# Patient Record
Sex: Male | Born: 1940
Health system: Southern US, Community
[De-identification: ages and names within clinical notes are randomized; demographics above are authoritative.]

## PROBLEM LIST (undated history)

## (undated) DIAGNOSIS — I10 Essential (primary) hypertension: Secondary | ICD-10-CM

## (undated) DIAGNOSIS — M1990 Unspecified osteoarthritis, unspecified site: Secondary | ICD-10-CM

## (undated) DIAGNOSIS — I219 Acute myocardial infarction, unspecified: Secondary | ICD-10-CM

## (undated) DIAGNOSIS — I2510 Atherosclerotic heart disease of native coronary artery without angina pectoris: Secondary | ICD-10-CM

## (undated) DIAGNOSIS — Z87442 Personal history of urinary calculi: Secondary | ICD-10-CM

## (undated) DIAGNOSIS — E782 Mixed hyperlipidemia: Secondary | ICD-10-CM

## (undated) HISTORY — PX: CARDIAC CATHETERIZATION: SHX172

## (undated) HISTORY — DX: Acute myocardial infarction, unspecified: I21.9

## (undated) HISTORY — DX: Essential (primary) hypertension: I10

## (undated) HISTORY — DX: Atherosclerotic heart disease of native coronary artery without angina pectoris: I25.10

## (undated) HISTORY — PX: OTHER SURGICAL HISTORY: SHX169

## (undated) HISTORY — DX: Mixed hyperlipidemia: E78.2

---

## 2005-12-16 HISTORY — PX: EXPLORATORY LAPAROTOMY: SUR591

## 2008-10-25 ENCOUNTER — Inpatient Hospital Stay (HOSPITAL_COMMUNITY): Admission: AD | Admit: 2008-10-25 | Discharge: 2008-10-27 | Payer: Self-pay | Admitting: Cardiology

## 2010-02-13 ENCOUNTER — Encounter: Payer: Self-pay | Admitting: Cardiology

## 2010-06-15 ENCOUNTER — Encounter: Payer: Self-pay | Admitting: Cardiology

## 2010-07-04 ENCOUNTER — Encounter: Payer: Self-pay | Admitting: Cardiology

## 2010-07-17 ENCOUNTER — Encounter: Payer: Self-pay | Admitting: Cardiology

## 2010-08-28 ENCOUNTER — Encounter: Payer: Self-pay | Admitting: Cardiology

## 2010-10-08 ENCOUNTER — Encounter: Payer: Self-pay | Admitting: Cardiology

## 2010-11-29 ENCOUNTER — Encounter: Payer: Self-pay | Admitting: Cardiology

## 2010-12-19 ENCOUNTER — Encounter: Payer: Self-pay | Admitting: Cardiology

## 2010-12-25 ENCOUNTER — Encounter: Payer: Self-pay | Admitting: Cardiology

## 2011-01-17 NOTE — Letter (Signed)
Summary: TERRY DANIEL,MD - OFFICE NOTE  TERRY DANIEL,MD - OFFICE NOTE   Imported By: Claudette Laws 12/27/2010 16:38:29  _____________________________________________________________________  External Attachment:    Type:   Image     Comment:   External Document

## 2011-01-17 NOTE — Letter (Signed)
Summary: External Correspondence/ [facility]  External Correspondence/ [facility]   Imported By: Dorise Hiss 12/27/2010 15:00:01  _____________________________________________________________________  External Attachment:    Type:   Image     Comment:   External Document

## 2011-01-17 NOTE — Letter (Signed)
Summary: External Correspondence/ [facility]  External Correspondence/ [facility]   Imported By: Dorise Hiss 12/27/2010 14:55:14  _____________________________________________________________________  External Attachment:    Type:   Image     Comment:   External Document

## 2011-01-17 NOTE — Letter (Signed)
Summary: Donzetta Sprung  MD- OFFICE NOTE  Donzetta Sprung  MD- OFFICE NOTE   Imported By: Claudette Laws 12/27/2010 16:48:19  _____________________________________________________________________  External Attachment:    Type:   Image     Comment:   External Document

## 2011-01-17 NOTE — Letter (Signed)
Summary: External Correspondence/ [facility]  External Correspondence/ [facility]   Imported By: Dorise Hiss 12/27/2010 14:51:56  _____________________________________________________________________  External Attachment:    Type:   Image     Comment:   External Document

## 2011-01-17 NOTE — Letter (Signed)
Summary: TERRY DANIEL,MD - OFFICE NOTE  TERRY DANIEL,MD - OFFICE NOTE   Imported By: Claudette Laws 12/27/2010 16:47:39  _____________________________________________________________________  External Attachment:    Type:   Image     Comment:   External Document

## 2011-01-17 NOTE — Letter (Signed)
Summary: External Correspondence/  [facility]  External Correspondence/  [facility]   Imported By: Dorise Hiss 12/27/2010 15:01:04  _____________________________________________________________________  External Attachment:    Type:   Image     Comment:   External Document

## 2011-01-17 NOTE — Letter (Signed)
Summary: External Correspondence/ [facility]  External Correspondence/ [facility]   Imported By: Dorise Hiss 12/27/2010 14:57:07  _____________________________________________________________________  External Attachment:    Type:   Image     Comment:   External Document

## 2011-01-17 NOTE — Letter (Signed)
Summary: External Correspondence/ [facility]  External Correspondence/ [facility]   Imported By: Dorise Hiss 12/27/2010 14:58:30  _____________________________________________________________________  External Attachment:    Type:   Image     Comment:   External Document

## 2011-01-29 ENCOUNTER — Ambulatory Visit: Payer: Self-pay | Admitting: Cardiology

## 2011-04-30 NOTE — Op Note (Signed)
NAMETAKSH, Harmon               ACCOUNT NO.:  [id number]   MEDICAL RECORD NO.:  [id number]          PATIENT TYPE:  INP   LOCATION:  2905                         FACILITY:  [facility]   PHYSICIAN:  Eduardo Osier. Sharyn Lull, M.D. DATE OF BIRTH:  23-Jun-1941   DATE OF PROCEDURE:  10/25/2008  DATE OF DISCHARGE:                               OPERATIVE REPORT   PROCEDURE:  1. Left cardiac cath with selective left and right coronary      angiography, LV graphy via right groin using Judkins technique.  2. Successful PTCA to mid RCA using 2.0 x 8-mm long Voyager balloon      and then 2.5 x 12-mm long Voyager balloon.  3. Successful deployment of 2.75 x 18-mm long Promus drug-eluting      stent in mid RCA.  4. Successful post dilatation of Promus drug-eluting stent using 3.0 x      15-mm long [place] Voyager balloon.   INDICATION FOR THE PROCEDURE:  David Harmon is a 70 year old white male  with past medical history significant for hypertension,  hypercholesteremia, remote history of tobacco abuse, and positive family  history of coronary artery disease.  He was transferred from [facility] for PCI.  The patient complains of recurrent exertional chest  pain off and on since Sunday, relieved with rest without associated  nausea, vomiting, or diaphoresis.  Denies any shortness of breath.  Denies palpitation, lightheadedness, or syncope.  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  EKG done at [facility] showed normal sinus rhythm with left  anterior fascicular block and right bundle-branch block with secondary  ST-T wave changes.  The patient received nitro paste, heparin, aspirin,  and beta-blockers at [facility] with relief of chest pain and was  transferred here for PCI.  I discussed with the patient regarding left  cath, possible PTCA, stenting, its risks and  benefits i.e. death, MI,  stroke, need for emergency CABG, risk of restenosis, local vascular  complications etc., and consented for the procedure.   PROCEDURE:  After obtaining the informed consent, the patient was  brought directly to the cath lab from [facility] and was placed  on fluoroscopy table.  Right groin was prepped and draped in usual  fashion.  A 2% Xylocaine was used for local anesthesia in the right  groin.  With the help of thin-wall needle, 6-French arterial sheath was  placed.  The sheath was aspirated and flushed.  Next, 6-French left  Judkins catheter was advanced over the wire under fluoroscopic guidance  up to the ascending aorta.  Wire was pulled out.  The catheter was  aspirated and connected to the manifold.  Catheter was further advanced  and engaged into left coronary ostium.  Multiple views of the left  system were taken.  Next, the catheter was disengaged and was pulled out  over the wire and was replaced with 6-French right Judkins catheter  which was advanced over the wire under fluoroscopic guidance  up to the  ascending aorta.  Wire was pulled out.  The catheter was aspirated and  connected to the manifold.  Catheter was further advanced and engaged  into right coronary ostium.  Multiple views of the right system were  taken.  Next, the catheter was disengaged and was pulled out over the  wire and was replaced with 6-French pigtail catheter which was advanced  over the wire under fluoroscopic guidance up to the ascending aorta.  Wire was pulled out.  The catheter was aspirated and connected to the  manifold.  Catheter was further advanced across aortic valve into the  LV.  LV pressures were recorded.  Next, LV graft was done in 30 degree  RAO position.  Post angiographic pressures were recorded from LV and  then pullback pressures were recorded from the aorta.  There was no  gradient across the aortic valve.  Next, the pigtail catheter was pulled   out over the wire.  Sheaths were aspirated and flushed.   FINDINGS:  LV showed mild inferior wall hypokinesia, EF of 50-55%.  Left  main was short which was patent.  LAD has 30-40% proximal stenosis.  Diagonal 1 has 50-60% ostial and proximal stenosis and 30-40% mid  stenosis in the inferior branch.  Left circumflex has 10-15% ostial and  50-60% proximal bifurcation stenosis with OM-2.  OM-1 is very very  small.  OM-2 has 20-30% ostial stenosis.  OM-3 is very very small.  OM-4  is moderate sized which is patent.  RCA has 10-15% proximal stenosis and  99% mid stenosis with haziness with TIMI +2 flow which is the culprit  lesion for his non-Q-wave MI.  PDA and PLV branches were small which had  mild disease.   INTERVENTIONAL PROCEDURE:  Successful PTCA to mid RCA was done using  initially 2.2 x 8-mm long Voyager balloon and then 2.5 x 12-mm long  Voyager balloon for predilatation and then 2.75 x 18-mm long Promus drug-  eluting stent was deployed at 13 atmospheric pressure in mid RCA.  Stent  was postdilated using 3.0 x 15-mm long [place] Voyager balloon going up to 18  atmospheric pressure.  Lesion was dilated from 99% to 0% residual with  excellent TIMI grade 3 distal flow without evidence of dissection or  distal embolization.  The patient received weight-based heparin,  Integrilin, and 600 mg of Plavix prior to the procedure.  The patient  tolerated the procedure well.  There were no complications.  The patient  was transferred to recovery room in stable condition.       Eduardo Osier. Sharyn Lull, M.D.  Electronically Signed     MNH/MEDQ  D:  10/25/2008  T:  10/26/2008  Job:  [id number]   cc:   Cath Lab

## 2011-04-30 NOTE — Discharge Summary (Signed)
NAMEAGUSTINE, ROSSITTO               ACCOUNT NO.:  [id number]   MEDICAL RECORD NO.:  [id number]          PATIENT TYPE:  INP   LOCATION:  3707                         FACILITY:  [facility]   PHYSICIAN:  Mohan N. Sharyn Lull, M.D. DATE OF BIRTH:  06/26/1941   DATE OF ADMISSION:  10/25/2008  DATE OF DISCHARGE:  10/27/2008                               DISCHARGE SUMMARY   ADMITTING DIAGNOSES:  1. Acute non-Q-wave myocardial infarction.  2. Hypertension.  3. Hypercholesteremia.  4. Remote history of tobacco abuse.  5. Positive family history of coronary artery disease.   DISCHARGE DIAGNOSES:  1. Status post acute non-Q-wave myocardial infarction, status post      percutaneous transluminal coronary angioplasty and stenting to      right coronary artery.  2. Hypertension.  3. Hypercholesteremia.  4. Remote history of tobacco abuse.  5. Positive family history of coronary artery disease.   DISCHARGE HOME MEDICATIONS:  1. Enteric-coated aspirin 325 mg 1 tablet daily.  2. Plavix 75 mg 1 tablet daily.  3. Zocor 40 mg 1 tablet daily.  4. Toprol-XL 25 mg half tablet daily.  5. Lisinopril 5 mg 1 tablet daily.  6. Nitrostat 0.4 mg sublingual as needed.  7. Niaspan 500 mg 1 tablet daily at night.   DIET:  Low salt, low cholesterol.   Post PTCA stent instructions have been given.   ACTIVITY:  Avoid any lifting, pushing, or pulling for 1 week.  Increase  activity slowly as tolerated.  Follow up with me in 1 week.   CONDITION AT DISCHARGE:  Stable.   BRIEF HISTORY AND HOSPITAL COURSE:  Mr. Rigor is a 70 year old white  male with past medical history significant for hypertension,  hypercholesteremia, remote history of tobacco abuse, positive family  history of coronary artery disease, transferred from [facility]  for PCI.  The patient complains of recurrent exertional chest pain off  and on since Sunday, relieved with rest without associated nausea,  vomiting, or diaphoresis.  Denies  shortness of breath.  Denies  palpitation, lightheadedness, or syncope.  The patient went to [facility] and was noted to have minimally elevated troponin I and CPK-MB.  EKG showed normal sinus rhythm with right bundle-branch block with left  anterior fascicular block with secondary ST-T wave changes.  The patient  received nitro paste, heparin, aspirin, and beta-blockers at [facility] with relief of chest pain and was transferred to [facility] for  further treatment.   PAST MEDICAL HISTORY:  As above.   PAST SURGICAL HISTORY:  He had laparotomy for intestinal obstruction  many years ago.   MEDICATIONS AT HOME:  He was on:  1. Hydrochlorothiazide 25 mg p.o. daily.  2. Zocor 40 mg p.o. daily.   ALLERGIES:  No known drug allergies.   SOCIAL HISTORY:  He smoked less than 1 pack for 2-3 years, quit 35 years  ago.  No history of alcohol or drug abuse.   FAMILY HISTORY:  Father died of MI at the age of 64.  Mother had MI x3  in her 62s.  One sister had heart problem.  PHYSICAL EXAMINATION:  GENERAL:  He is alert, awake, and oriented x3, in  no acute distress.  VITAL SIGNS:  Blood pressure was 104/67 and pulse was 58 and regular.  HEENT:  Conjunctivae was pink.  NECK:  Supple.  No JVD.  No bruit.  LUNGS:  Clear to auscultation without rhonchi or rales.  CARDIOVASCULAR:  S1 and S2 was normal.  There was soft systolic murmur.  ABDOMEN:  Soft.  Bowel sounds are present.  Nontender.  EXTREMITIES:  There is no clubbing, cyanosis, or edema.   LABORATORIES:  Hemoglobin was 12.4, hematocrit 37.1, and white count of  7.6.  His BUN was 9, creatinine 0.67, glucose 90, and potassium 3.4.  His cardiac enzymes; CPK was 211, MB of 16.4, and relative index 7.8.  Repeat CPK was 202, MB 12.2, and relative index 6.0.  Troponin I was  1.41.  Today's CPK is 141, MB 2.8, and relative index 2.0.  Troponin I  has come down to 0.60.  Today, his hemoglobin is 13.7, hematocrit 41.1,  and  white count of 7.3.  His BUN is 13, creatinine 0.79, glucose 98, and  potassium 3.7.  His total cholesterol was 128, LDL was 79, and HDL was  30.   BRIEF HOSPITAL COURSE:  The patient was transferred from [facility] directly to the [facility] Cath Lab.  The patient  subsequently underwent left cardiac cath with selective left and right  coronary angiography and PTCA and stenting to mid RCA.   HOSPITAL PROCEDURE REPORT:  The patient did not have any complications.  Postprocedure, the patient did not have any episodes of chest pain or  cardiac arrhythmias.  His groin is stable with no evidence of hematoma  or bruit.  Phase I cardiac rehab was called.  The patient has been  ambulating in hallway without any problems.  The patient will be  discharged home on above medications.      Eduardo Osier. Sharyn Lull, M.D.  Electronically Signed     MNH/MEDQ  D:  10/27/2008  T:  10/27/2008  Job:  [id number]

## 2011-07-08 ENCOUNTER — Encounter: Payer: Self-pay | Admitting: Cardiology

## 2011-07-10 ENCOUNTER — Encounter: Payer: Self-pay | Admitting: Cardiology

## 2011-07-10 ENCOUNTER — Ambulatory Visit (INDEPENDENT_AMBULATORY_CARE_PROVIDER_SITE_OTHER): Payer: Medicare (Managed Care) | Admitting: Cardiology

## 2011-07-10 VITALS — BP 128/90 | HR 75 | Resp 18 | Ht 70.0 in | Wt 181.8 lb

## 2011-07-10 DIAGNOSIS — R002 Palpitations: Secondary | ICD-10-CM

## 2011-07-10 DIAGNOSIS — E782 Mixed hyperlipidemia: Secondary | ICD-10-CM

## 2011-07-10 DIAGNOSIS — I2510 Atherosclerotic heart disease of native coronary artery without angina pectoris: Secondary | ICD-10-CM | POA: Insufficient documentation

## 2011-07-10 DIAGNOSIS — I10 Essential (primary) hypertension: Secondary | ICD-10-CM

## 2011-07-10 NOTE — Progress Notes (Signed)
Clinical Summary [name] is a 70 y.o.male presenting to establish ongoing cardiology followup. He has been followed by Dr. Sharyn Lull regularly since 2009 with intervention outlined below. He is changing practice related to insurance coverage.  From a symptom perspective he reports doing quite well with no major functional limitations, no angina, NYHA class I dyspnea on exertion. He reports compliance with his medications, back on statin therapy within the last 3 months under the direction of Dr. Reuel Boom who has been following his lipids.   ECG is reviewed below. He denies any palpitations or syncope, no orthopnea or PND, no lower extremity edema.  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  No Known Allergies  Medication list reviewed.  Past Medical History  Diagnosis Date  . Coronary atherosclerosis of native coronary artery     DES RCA 11/09 (Dr. Sharyn Lull)  . Myocardial infarction     NSTEMI 11/09  . Mixed hyperlipidemia   . Essential hypertension, benign     Past Surgical History  Procedure Date  . Exploratory laparotomy 2007    Small bowel obstruction  . Resection of pilonidal cyst     Family History  Problem Relation Age of Onset  . Coronary artery disease Father   . Coronary artery disease Mother   . Diabetes type II Sister     Social History Mr. Harmon reports that he has quit smoking. His smoking use included Cigarettes. He has never used smokeless tobacco. [name] reports that he does not drink alcohol.  Review of Systems Mentions arthritic discomfort, mainly in his hands. Otherwise reviewed and negative.  Physical Examination Filed Vitals:   07/10/11 1503  BP: 158/85  Pulse: 63  Resp: 18  Well-developed, bearded male in no acute distress. HEENT: Conjunctiva and lids normal, oropharynx with moist mucosa. Neck: Supple, no elevated  JVP or carotid bruits, no thyromegaly. Lungs: Clear to auscultation, nonlabored. Cardiac: Regular rate and rhythm, no S3 gallop or pericardial rub, no significant murmur. Abdomen: Soft, nontender, no bruits, bowel sounds present. Skin: Warm and dry, no ulcerations. Extremities: No pitting edema, distal pulses 1-2+. Musculoskeletal: No kyphosis. Osteoarthritic changes in the hands. Neuropsychiatric: Alert and oriented x3, affect appropriate.   ECG Normal sinus rhythm with right bundle branch block and left anterior fascicular block.   Problem List and Plan

## 2011-07-10 NOTE — Patient Instructions (Signed)
Your physician wants you to follow-up in: 6 months. You will receive a reminder letter in the mail one-two months in advance. If you don't receive a letter, please call our office to schedule the follow-up appointment. Your physician recommends that you continue on your current medications as directed. Please refer to the Current Medication list given to you today. 

## 2011-07-10 NOTE — Assessment & Plan Note (Signed)
Symptomatically stable on medical therapy. ECG reviewed. Plan to continue observation at this time. Seems functionally quite active without major limitation. Can consider a followup stress test further down the road, sooner if symptoms intervene.

## 2011-07-10 NOTE — Assessment & Plan Note (Signed)
Followed by Dr. Reuel Boom. Will request most recent lab work for review. Patient tolerating Lipitor.

## 2011-07-10 NOTE — Assessment & Plan Note (Signed)
Continue to keep an eye on blood pressure. He does check it at home.

## 2011-08-01 ENCOUNTER — Other Ambulatory Visit: Payer: Self-pay | Admitting: *Deleted

## 2011-08-01 MED ORDER — LISINOPRIL 10 MG PO TABS: 10.0000 mg | ORAL_TABLET | Freq: Every day | ORAL | Status: DC

## 2011-08-26 ENCOUNTER — Other Ambulatory Visit: Payer: Self-pay | Admitting: *Deleted

## 2011-08-26 MED ORDER — CLOPIDOGREL BISULFATE 75 MG PO TABS: 75.0000 mg | ORAL_TABLET | Freq: Every day | ORAL | Status: DC

## 2011-09-17 LAB — BASIC METABOLIC PANEL
BUN: 13
CO2: 26
Chloride: 106
Creatinine, Ser: 0.79
Potassium: 3.7

## 2011-09-17 LAB — CARDIAC PANEL(CRET KIN+CKTOT+MB+TROPI)
Relative Index: 2
Total CK: 141
Troponin I: 1.41

## 2011-09-17 LAB — COMPREHENSIVE METABOLIC PANEL
ALT: 15
AST: 28
Alkaline Phosphatase: 35 — ABNORMAL LOW
Alkaline Phosphatase: 35 — ABNORMAL LOW
BUN: 9
CO2: 24
CO2: 29
Chloride: 105
Chloride: 105
GFR calc non Af Amer: >60
Glucose, Bld: 90
Glucose, Bld: 98
Potassium: 3.4 — ABNORMAL LOW
Potassium: 3.8
Sodium: 137
Total Bilirubin: 0.9
Total Bilirubin: 0.9

## 2011-09-17 LAB — CBC
HCT: 37.1 — ABNORMAL LOW
HCT: 41.1
Hemoglobin: 12.4 — ABNORMAL LOW
Hemoglobin: 12.6 — ABNORMAL LOW
MCHC: 33.3
MCHC: 33.5
MCV: 90.9
Platelets: 243
RBC: 4.18 — ABNORMAL LOW
RBC: 4.52
RDW: 12.8
WBC: 7.3
WBC: 8.4

## 2011-09-17 LAB — DIFFERENTIAL
Basophils Absolute: 0
Basophils Relative: 0
Neutro Abs: 5
Neutrophils Relative %: 65

## 2011-09-17 LAB — LIPID PANEL
Cholesterol: 128
Total CHOL/HDL Ratio: 4.3
VLDL: 19

## 2012-03-16 ENCOUNTER — Ambulatory Visit: Payer: Medicare (Managed Care) | Admitting: Cardiology

## 2012-03-17 ENCOUNTER — Ambulatory Visit (INDEPENDENT_AMBULATORY_CARE_PROVIDER_SITE_OTHER): Payer: Medicare Other | Admitting: Cardiology

## 2012-03-17 ENCOUNTER — Encounter: Payer: Self-pay | Admitting: Cardiology

## 2012-03-17 VITALS — BP 162/83 | HR 68 | Ht 70.0 in | Wt 182.0 lb

## 2012-03-17 DIAGNOSIS — I10 Essential (primary) hypertension: Secondary | ICD-10-CM

## 2012-03-17 DIAGNOSIS — E782 Mixed hyperlipidemia: Secondary | ICD-10-CM

## 2012-03-17 DIAGNOSIS — I2510 Atherosclerotic heart disease of native coronary artery without angina pectoris: Secondary | ICD-10-CM

## 2012-03-17 NOTE — Patient Instructions (Signed)
Your physician wants you to follow-up in: 6 months. You will receive a reminder letter in the mail one-two months in advance. If you don't receive a letter, please call our office to schedule the follow-up appointment. Your physician recommends that you continue on your current medications as directed. Please refer to the Current Medication list given to you today. 

## 2012-03-17 NOTE — Progress Notes (Signed)
   Clinical Summary [name] is a 71 y.o.male presenting for followup. He was seen in July 2012. He continues to do well, works full time. Reports no angina, no nitroglycerin use. No increasing shortness of breath. Followup ECG is stable.  Recent lab work from January showed BUN 15, creatinine 0.9, potassium 5.0, AST 25, ALT 21, cholesterol 130, triglycerides 60, HDL 47, LDL 71. He reports compliance with his medications.  We did discuss potential for followup stress testing, although at this point he was most comfortable with observation, not unreasonable since he has been symptomatically stable.  Blood pressure was up some today compared to last visit. He states that it has typically been better when he checks it. He does have a blood pressure cuff at home.  No Known Allergies  Current Outpatient Prescriptions  Medication Sig Dispense Refill  . aspirin 81 MG tablet Take 81 mg by mouth daily.        Marland Kitchen atorvastatin (LIPITOR) 40 MG tablet Take 40 mg by mouth daily.        . clopidogrel (PLAVIX) 75 MG tablet Take 1 tablet (75 mg total) by mouth daily.  90 tablet  3  . imiquimod (ALDARA) 5 % cream Apply 1 application topically as needed.       Marland Kitchen lisinopril (PRINIVIL,ZESTRIL) 10 MG tablet Take 1 tablet (10 mg total) by mouth daily.  90 tablet  3  . metoprolol succinate (TOPROL-XL) 25 MG 24 hr tablet Take 25 mg by mouth daily.        . Multiple Vitamins-Minerals (DAILY MENS HEALTH FORMULA PO) Take 1 tablet by mouth daily.       . nitroGLYCERIN (NITROSTAT) 0.4 MG SL tablet Place 0.4 mg under the tongue every 5 (five) minutes as needed.        Past Medical History  Diagnosis Date  . Coronary atherosclerosis of native coronary artery     DES RCA 11/09 (Dr. Sharyn Lull)  . Myocardial infarction     NSTEMI 11/09  . Mixed hyperlipidemia   . Essential hypertension, benign     Social History Mr. Konecny reports that he quit smoking about 46 years ago. His smoking use included Cigarettes. He has  a 5 pack-year smoking history. He has never used smokeless tobacco. Mr. Jewel reports that he does not drink alcohol.  Review of Systems No palpitations, orthopnea, PND. No bleeding problems. Otherwise negative.  Physical Examination Filed Vitals:   03/17/12 1324  BP: 162/83  Pulse: 68   Well-developed, bearded male in no acute distress.  HEENT: Conjunctiva and lids normal, oropharynx with moist mucosa.  Neck: Supple, no elevated JVP or carotid bruits, no thyromegaly.  Lungs: Clear to auscultation, nonlabored.  Cardiac: Regular rate and rhythm, no S3 gallop or pericardial rub, no significant murmur.  Abdomen: Soft, nontender, no bruits, bowel sounds present.  Skin: Warm and dry, no ulcerations.  Extremities: No pitting edema, distal pulses 1-2+.  Musculoskeletal: No kyphosis. Osteoarthritic changes in the hands.  Neuropsychiatric: Alert and oriented x3, affect appropriate.   ECG Sinus rhythm with right bundle-branch block and left anterior fascicular block, LVH.   Problem List and Plan

## 2012-03-17 NOTE — Assessment & Plan Note (Signed)
Symptomatically stable on medical therapy. We discussed warning signs. He remains comfortable of observation at this point.

## 2012-03-17 NOTE — Assessment & Plan Note (Signed)
Blood pressure is up today, was better controlled at the last visit. I asked him to keep a log of his blood pressure at home. He may need further medication adjustment.

## 2012-03-17 NOTE — Assessment & Plan Note (Signed)
Lipids have been well controlled. 

## 2012-04-23 ENCOUNTER — Other Ambulatory Visit: Payer: Self-pay | Admitting: *Deleted

## 2012-04-23 MED ORDER — METOPROLOL SUCCINATE ER 25 MG PO TB24: 25.0000 mg | ORAL_TABLET | Freq: Every day | ORAL | Status: DC

## 2012-04-27 ENCOUNTER — Other Ambulatory Visit: Payer: Self-pay | Admitting: *Deleted

## 2012-04-27 MED ORDER — LISINOPRIL 10 MG PO TABS: 10.0000 mg | ORAL_TABLET | Freq: Every day | ORAL | Status: DC

## 2012-09-07 ENCOUNTER — Ambulatory Visit: Payer: Medicare Other | Admitting: Cardiology

## 2012-09-18 ENCOUNTER — Encounter: Payer: Self-pay | Admitting: Cardiology

## 2012-09-18 ENCOUNTER — Ambulatory Visit (INDEPENDENT_AMBULATORY_CARE_PROVIDER_SITE_OTHER): Payer: Medicare Other | Admitting: Cardiology

## 2012-09-18 VITALS — BP 154/86 | HR 67 | Ht 70.5 in | Wt 182.0 lb

## 2012-09-18 DIAGNOSIS — E782 Mixed hyperlipidemia: Secondary | ICD-10-CM

## 2012-09-18 DIAGNOSIS — I10 Essential (primary) hypertension: Secondary | ICD-10-CM

## 2012-09-18 DIAGNOSIS — I2510 Atherosclerotic heart disease of native coronary artery without angina pectoris: Secondary | ICD-10-CM

## 2012-09-18 MED ORDER — NITROGLYCERIN 0.4 MG SL SUBL: 0.4000 mg | SUBLINGUAL_TABLET | SUBLINGUAL | Status: DC | PRN

## 2012-09-18 NOTE — Progress Notes (Signed)
   Clinical Summary [name] is a 71 y.o.male presenting for followup. He was seen in April. He continues to do very well, working full time, also enjoying outdoor chores. He reports no active angina symptoms or unusual shortness of breath. States that he has been tolerating his medications well.  ECG review today is stable. He has had lab work and followup with Dr. Reuel Boom. Reports no change in his lipid regimen.   No Known Allergies  Current Outpatient Prescriptions  Medication Sig Dispense Refill  . aspirin 81 MG tablet Take 81 mg by mouth daily.        Marland Kitchen atorvastatin (LIPITOR) 40 MG tablet Take 40 mg by mouth daily.        . clopidogrel (PLAVIX) 75 MG tablet Take 1 tablet (75 mg total) by mouth daily.  90 tablet  3  . imiquimod (ALDARA) 5 % cream Apply 1 application topically as needed.       Marland Kitchen lisinopril (PRINIVIL,ZESTRIL) 10 MG tablet Take 1 tablet (10 mg total) by mouth daily.  90 tablet  3  . metoprolol succinate (TOPROL-XL) 25 MG 24 hr tablet Take 1 tablet (25 mg total) by mouth daily.  30 tablet  6  . Multiple Vitamins-Minerals (DAILY MENS HEALTH FORMULA PO) Take 1 tablet by mouth daily.       . nitroGLYCERIN (NITROSTAT) 0.4 MG SL tablet Place 1 tablet (0.4 mg total) under the tongue every 5 (five) minutes as needed for chest pain.  25 tablet  6  . DISCONTD: nitroGLYCERIN (NITROSTAT) 0.4 MG SL tablet Place 0.4 mg under the tongue every 5 (five) minutes as needed.        Past Medical History  Diagnosis Date  . Coronary atherosclerosis of native coronary artery     DES RCA 11/09 (Dr. Sharyn Lull)  . Myocardial infarction     NSTEMI 11/09  . Mixed hyperlipidemia   . Essential hypertension, benign     Social History Mr. Dilauro reports that he quit smoking about 46 years ago. His smoking use included Cigarettes. He has a 5 pack-year smoking history. He has never used smokeless tobacco. Mr. Swoyer reports that he does not drink alcohol.  Review of Systems No dizziness,  palpitations, syncope. No bleeding episodes. No claudication. Otherwise negative.  Physical Examination Filed Vitals:   09/18/12 1456  BP: 154/86  Pulse: 67   Filed Weights   09/18/12 1456  Weight: 182 lb (82.555 kg)    HEENT: Conjunctiva and lids normal, oropharynx with moist mucosa.  Neck: Supple, no elevated JVP or carotid bruits, no thyromegaly.  Lungs: Clear to auscultation, nonlabored.  Cardiac: Regular rate and rhythm, no S3 gallop or pericardial rub, no significant murmur.  Abdomen: Soft, nontender, no bruits, bowel sounds present.  Skin: Warm and dry, no ulcerations.  Extremities: No pitting edema, distal pulses 1-2+.  Musculoskeletal: No kyphosis. Osteoarthritic changes in the hands.  Neuropsychiatric: Alert and oriented x3, affect appropriate.    Problem List and Plan   Coronary atherosclerosis of native coronary artery Symptomatically stable on medical therapy. ECG is also stable. We continue a strategy of observation. We have discussed warning signs that might precipitate further cardiac testing. Followup arranged.  Essential hypertension, benign I asked him to keep an eye on his blood pressure as before. Could always consider a combination ACE inhibitor HCTZ. Keep regular followup with Dr. Reuel Boom.  Mixed hyperlipidemia Continue Lipitor.    Jonelle Sidle, M.D., F.A.C.C.

## 2012-09-18 NOTE — Patient Instructions (Addendum)
Your physician recommends that you schedule a follow-up appointment in: 6 months  

## 2012-09-18 NOTE — Assessment & Plan Note (Signed)
Symptomatically stable on medical therapy. ECG is also stable. We continue a strategy of observation. We have discussed warning signs that might precipitate further cardiac testing. Followup arranged.

## 2012-09-18 NOTE — Assessment & Plan Note (Addendum)
I asked him to keep an eye on his blood pressure as before. Could always consider a combination ACE inhibitor HCTZ. Keep regular followup with Dr. Reuel Boom.

## 2012-09-18 NOTE — Assessment & Plan Note (Signed)
-  Continue Lipitor °

## 2012-09-21 ENCOUNTER — Other Ambulatory Visit: Payer: Self-pay | Admitting: Cardiology

## 2012-09-21 MED ORDER — CLOPIDOGREL BISULFATE 75 MG PO TABS: 75.0000 mg | ORAL_TABLET | Freq: Every day | ORAL | Status: DC

## 2012-11-18 ENCOUNTER — Other Ambulatory Visit: Payer: Self-pay | Admitting: Cardiology

## 2012-11-18 MED ORDER — METOPROLOL SUCCINATE ER 25 MG PO TB24: 25.0000 mg | ORAL_TABLET | Freq: Every day | ORAL | Status: DC

## 2013-03-31 ENCOUNTER — Ambulatory Visit (INDEPENDENT_AMBULATORY_CARE_PROVIDER_SITE_OTHER): Payer: Medicare Other | Admitting: Cardiology

## 2013-03-31 ENCOUNTER — Encounter: Payer: Self-pay | Admitting: Cardiology

## 2013-03-31 VITALS — BP 180/96 | HR 71 | Ht 70.0 in | Wt 184.0 lb

## 2013-03-31 DIAGNOSIS — I2510 Atherosclerotic heart disease of native coronary artery without angina pectoris: Secondary | ICD-10-CM

## 2013-03-31 DIAGNOSIS — I10 Essential (primary) hypertension: Secondary | ICD-10-CM

## 2013-03-31 DIAGNOSIS — E782 Mixed hyperlipidemia: Secondary | ICD-10-CM

## 2013-03-31 NOTE — Progress Notes (Signed)
   Clinical Summary David Harmon is a 72 y.o.male last seen in October 2013. He has been doing very well. No angina or unusual shortness of breath. 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  ECG today shows sinus rhythm with RBBB and LAFB, LVH.  He had a recent wellness visit with Dr. Reuel Harmon, states that his lipid profile looked good. Blood pressure also was much better at that visit. I asked him to keep an eye on this.   No Known Allergies  Current Outpatient Prescriptions  Medication Sig Dispense Refill  . aspirin 81 MG tablet Take 81 mg by mouth daily.        Marland Kitchen atorvastatin (LIPITOR) 40 MG tablet Take 40 mg by mouth daily.        . clopidogrel (PLAVIX) 75 MG tablet Take 1 tablet (75 mg total) by mouth daily.  90 tablet  3  . imiquimod (ALDARA) 5 % cream Apply 1 application topically as needed.       Marland Kitchen lisinopril (PRINIVIL,ZESTRIL) 10 MG tablet Take 1 tablet (10 mg total) by mouth daily.  90 tablet  3  . metoprolol succinate (TOPROL-XL) 25 MG 24 hr tablet Take 1 tablet (25 mg total) by mouth daily.  30 tablet  6  . Multiple Vitamins-Minerals (DAILY MENS HEALTH FORMULA PO) Take 1 tablet by mouth daily.       . nitroGLYCERIN (NITROSTAT) 0.4 MG SL tablet Place 1 tablet (0.4 mg total) under the tongue every 5 (five) minutes as needed for chest pain.  25 tablet  6   No current facility-administered medications for this visit.    Past Medical History  Diagnosis Date  . Coronary atherosclerosis of native coronary artery     DES RCA 11/09 (Dr. Sharyn Lull)  . Myocardial infarction     NSTEMI 11/09  . Mixed hyperlipidemia   . Essential hypertension, benign     Social History David Harmon reports that he quit smoking about 47 years ago. His smoking use included Cigarettes. He has a 5 pack-year smoking history. He has never used smokeless tobacco. David Harmon reports that he does not drink alcohol.  Review of Systems No palpitations or syncope. No bleeding problems. No orthopnea or  PND. No claudication. Otherwise negative.  Physical Examination Filed Vitals:   03/31/13 1529  BP: 180/96  Pulse: 71   Filed Weights   03/31/13 1529  Weight: 184 lb (83.462 kg)    HEENT: Conjunctiva and lids normal, oropharynx with moist mucosa.  Neck: Supple, no elevated JVP or carotid bruits, no thyromegaly.  Lungs: Clear to auscultation, nonlabored.  Cardiac: Regular rate and rhythm, no S3 gallop or pericardial rub, no significant murmur.  Abdomen: Soft, nontender, no bruits, bowel sounds present.  Skin: Warm and dry, no ulcerations.  Extremities: No pitting edema, distal pulses 1-2+.  Musculoskeletal: No kyphosis. Osteoarthritic changes in the hands.  Neuropsychiatric: Alert and oriented x3, affect appropriate.   Problem List and Plan   Coronary atherosclerosis of native coronary artery Doing well symptomatically on medical therapy. ECG reviewed. Continue observation.  Essential hypertension, benign Blood pressure is elevated today, but David Harmon states that he has been compliant with his medicines, and it typically it is much better. I did ask him to keep an eye on this.  Mixed hyperlipidemia Continue Lipitor, followup with Dr. Reuel Harmon. Goal LDL should be close to 70 if possible, certainly under 100.    Jonelle Sidle, M.D., F.A.C.C.

## 2013-03-31 NOTE — Assessment & Plan Note (Signed)
Blood pressure is elevated today, but David Harmon states that he has been compliant with his medicines, and it typically it is much better. I did ask him to keep an eye on this.

## 2013-03-31 NOTE — Patient Instructions (Addendum)
Your physician recommends that you schedule a follow-up appointment in: 6 months  

## 2013-03-31 NOTE — Assessment & Plan Note (Signed)
Doing well symptomatically on medical therapy. ECG reviewed. Continue observation.

## 2013-03-31 NOTE — Assessment & Plan Note (Signed)
Continue Lipitor, followup with Dr. Reuel Boom. Goal LDL should be close to 70 if possible, certainly under 100.

## 2013-04-06 ENCOUNTER — Other Ambulatory Visit: Payer: Self-pay | Admitting: *Deleted

## 2013-04-06 MED ORDER — LISINOPRIL 10 MG PO TABS: 10.0000 mg | ORAL_TABLET | Freq: Every day | ORAL | Status: DC

## 2013-06-21 ENCOUNTER — Other Ambulatory Visit: Payer: Self-pay | Admitting: Cardiology

## 2013-06-21 MED ORDER — METOPROLOL SUCCINATE ER 25 MG PO TB24: 25.0000 mg | ORAL_TABLET | Freq: Every day | ORAL | Status: DC

## 2013-08-10 ENCOUNTER — Other Ambulatory Visit: Payer: Self-pay | Admitting: *Deleted

## 2013-08-10 MED ORDER — CLOPIDOGREL BISULFATE 75 MG PO TABS: 75.0000 mg | ORAL_TABLET | Freq: Every day | ORAL | Status: DC

## 2013-10-29 ENCOUNTER — Encounter: Payer: Self-pay | Admitting: Cardiology

## 2013-10-29 ENCOUNTER — Ambulatory Visit (INDEPENDENT_AMBULATORY_CARE_PROVIDER_SITE_OTHER): Payer: Medicare Other | Admitting: Cardiology

## 2013-10-29 VITALS — BP 128/77 | HR 64 | Ht 70.0 in | Wt 185.0 lb

## 2013-10-29 DIAGNOSIS — I2510 Atherosclerotic heart disease of native coronary artery without angina pectoris: Secondary | ICD-10-CM

## 2013-10-29 DIAGNOSIS — I10 Essential (primary) hypertension: Secondary | ICD-10-CM

## 2013-10-29 DIAGNOSIS — E782 Mixed hyperlipidemia: Secondary | ICD-10-CM

## 2013-10-29 NOTE — Patient Instructions (Addendum)
Your physician recommends that you schedule a follow-up appointment in: 6 months  Your physician recommends that you continue on your current medications as directed. Please refer to the Current Medication list given to you today.  

## 2013-10-29 NOTE — Progress Notes (Signed)
    Clinical Summary [name] is a 72 y.o.male last seen in April. He continues to do very well, no angina or significant shortness of breath, no cardiac hospitalizations. 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  He reports compliance with his medications including D.APT which he has been on long-term. We have discussed possibility of going to just aspirin alone, however he has done exceptionally well, and is reluctant to make any changes now.  He continues to follow with Dr. Reuel Boom, reports good lipid panel within the last 6 months. He has tolerated Lipitor.   No Known Allergies  Current Outpatient Prescriptions  Medication Sig Dispense Refill  . aspirin 81 MG tablet Take 81 mg by mouth daily.        Marland Kitchen atorvastatin (LIPITOR) 40 MG tablet Take 40 mg by mouth daily.        . clopidogrel (PLAVIX) 75 MG tablet Take 1 tablet (75 mg total) by mouth daily.  90 tablet  3  . lisinopril-hydrochlorothiazide (PRINZIDE,ZESTORETIC) 20-12.5 MG per tablet Take 1 tablet by mouth daily.       . metoprolol succinate (TOPROL-XL) 25 MG 24 hr tablet Take 1 tablet (25 mg total) by mouth daily.  30 tablet  6  . Multiple Vitamins-Minerals (DAILY MENS HEALTH FORMULA PO) Take 1 tablet by mouth daily.       . nitroGLYCERIN (NITROSTAT) 0.4 MG SL tablet Place 1 tablet (0.4 mg total) under the tongue every 5 (five) minutes as needed for chest pain.  25 tablet  6   No current facility-administered medications for this visit.    Past Medical History  Diagnosis Date  . Coronary atherosclerosis of native coronary artery     DES RCA 11/09 (Dr. Sharyn Lull)  . Myocardial infarction     NSTEMI 11/09  . Mixed hyperlipidemia   . Essential hypertension, benign     Social History Mr. Postiglione reports that he quit smoking about 47 years ago. His smoking use included Cigarettes. He has a 5 pack-year smoking history. He has never used smokeless tobacco. Mr. Whitner reports that he does not drink  alcohol.  Review of Systems No palpitations, dizziness, bleeding problems, syncope. Otherwise negative.  Physical Examination Filed Vitals:   10/29/13 1430  BP: 128/77  Pulse: 64   Filed Weights   10/29/13 1430  Weight: 185 lb (83.915 kg)    Appears comfortable at rest. HEENT: Conjunctiva and lids normal, oropharynx with moist mucosa.  Neck: Supple, no elevated JVP or carotid bruits, no thyromegaly.  Lungs: Clear to auscultation, nonlabored.  Cardiac: Regular rate and rhythm, no S3 gallop or pericardial rub, no significant murmur.  Abdomen: Soft, nontender, no bruits, bowel sounds present.  Skin: Warm and dry, no ulcerations.  Extremities: No pitting edema, distal pulses 1-2+.  Musculoskeletal: No kyphosis. Osteoarthritic changes in the hands.  Neuropsychiatric: Alert and oriented x3, affect appropriate.   Problem List and Plan   Coronary atherosclerosis of native coronary artery Doing well, symptomatically stable medical therapy. No changes were made today.  Essential hypertension, benign Blood pressure control is good today.  Mixed hyperlipidemia Keep follow up with Dr. Reuel Boom, patient tolerated Lipitor.    Jonelle Sidle, M.D., F.A.C.C.

## 2013-10-29 NOTE — Assessment & Plan Note (Signed)
Blood pressure control is good today. 

## 2013-10-29 NOTE — Assessment & Plan Note (Signed)
Keep follow up with Dr. Reuel Boom, patient tolerated Lipitor.

## 2013-10-29 NOTE — Assessment & Plan Note (Signed)
Doing well, symptomatically stable medical therapy. No changes were made today.

## 2013-11-22 ENCOUNTER — Telehealth: Payer: Self-pay | Admitting: Cardiology

## 2013-11-22 NOTE — Telephone Encounter (Signed)
Noted pt was on the combo tablet for lisinopril/hctz 20-12.5 per PCP on 08-2014, pt wife confirmed this is the medication he is taking, contacted Corine at [facility] to advise the lisinopril 10mg  was still on his courtesy refill list to automatically sends to the MD office, corine confirmed this refill request should not have been generated and they will remove the refill request from the automatic list.

## 2013-11-22 NOTE — Telephone Encounter (Signed)
Received fax refill request  Rx # [id number] Medication:  Lisinopril 10 mg tablet Qty 90 Sig:  Take one tablet by mouth once daily Physician:  Diona Browner

## 2014-02-22 ENCOUNTER — Emergency Department (HOSPITAL_COMMUNITY): Payer: Medicare Other

## 2014-02-22 ENCOUNTER — Emergency Department (HOSPITAL_COMMUNITY)
Admission: EM | Admit: 2014-02-22 | Discharge: 2014-02-22 | Disposition: A | Discharge status: Home / Self Care | Payer: Medicare Other | Attending: Emergency Medicine | Admitting: Emergency Medicine

## 2014-02-22 ENCOUNTER — Encounter (HOSPITAL_COMMUNITY): Payer: Self-pay | Admitting: Emergency Medicine

## 2014-02-22 DIAGNOSIS — E782 Mixed hyperlipidemia: Secondary | ICD-10-CM | POA: Insufficient documentation

## 2014-02-22 DIAGNOSIS — I10 Essential (primary) hypertension: Secondary | ICD-10-CM | POA: Insufficient documentation

## 2014-02-22 DIAGNOSIS — Z7982 Long term (current) use of aspirin: Secondary | ICD-10-CM | POA: Insufficient documentation

## 2014-02-22 DIAGNOSIS — Z79899 Other long term (current) drug therapy: Secondary | ICD-10-CM | POA: Insufficient documentation

## 2014-02-22 DIAGNOSIS — I252 Old myocardial infarction: Secondary | ICD-10-CM | POA: Insufficient documentation

## 2014-02-22 DIAGNOSIS — N200 Calculus of kidney: Secondary | ICD-10-CM

## 2014-02-22 DIAGNOSIS — I2510 Atherosclerotic heart disease of native coronary artery without angina pectoris: Secondary | ICD-10-CM | POA: Insufficient documentation

## 2014-02-22 DIAGNOSIS — Z87891 Personal history of nicotine dependence: Secondary | ICD-10-CM | POA: Insufficient documentation

## 2014-02-22 DIAGNOSIS — Z7902 Long term (current) use of antithrombotics/antiplatelets: Secondary | ICD-10-CM | POA: Insufficient documentation

## 2014-02-22 LAB — COMPREHENSIVE METABOLIC PANEL
ALT: 19 U/L (ref 0–53)
AST: 31 U/L (ref 0–37)
Albumin: 4.2 g/dL (ref 3.5–5.2)
Alkaline Phosphatase: 58 U/L (ref 39–117)
BUN: 18 mg/dL (ref 6–23)
CALCIUM: 9.5 mg/dL (ref 8.4–10.5)
CO2: 28 mEq/L (ref 19–32)
Chloride: 100 mEq/L (ref 96–112)
Creatinine, Ser: 0.96 mg/dL (ref 0.50–1.35)
GFR calc Af Amer: >90 mL/min (ref >90)
GFR calc non Af Amer: 81 mL/min — ABNORMAL LOW (ref >90)
Glucose, Bld: 128 mg/dL — ABNORMAL HIGH (ref 70–99)
Potassium: 4.2 mEq/L (ref 3.7–5.3)
SODIUM: 139 meq/L (ref 137–147)
TOTAL PROTEIN: 7.3 g/dL (ref 6.0–8.3)
Total Bilirubin: 0.6 mg/dL (ref 0.3–1.2)

## 2014-02-22 LAB — URINALYSIS, ROUTINE W REFLEX MICROSCOPIC
Glucose, UA: NEGATIVE mg/dL
Ketones, ur: NEGATIVE mg/dL
Leukocytes, UA: NEGATIVE
Nitrite: NEGATIVE
PROTEIN: 100 mg/dL — AB
Specific Gravity, Urine: >1.03 — ABNORMAL HIGH (ref 1.005–1.030)
UROBILINOGEN UA: 0.2 mg/dL (ref 0.0–1.0)
pH: 5 (ref 5.0–8.0)

## 2014-02-22 LAB — CBC WITH DIFFERENTIAL/PLATELET
BASOS ABS: 0 10*3/uL (ref 0.0–0.1)
BASOS PCT: 0 % (ref 0–1)
EOS ABS: 0.1 10*3/uL (ref 0.0–0.7)
EOS PCT: 1 % (ref 0–5)
HCT: 42.3 % (ref 39.0–52.0)
Hemoglobin: 14.3 g/dL (ref 13.0–17.0)
Lymphocytes Relative: 12 % (ref 12–46)
Lymphs Abs: 1.1 10*3/uL (ref 0.7–4.0)
MCH: 30.1 pg (ref 26.0–34.0)
MCHC: 33.8 g/dL (ref 30.0–36.0)
MCV: 89.1 fL (ref 78.0–100.0)
Monocytes Absolute: 0.5 10*3/uL (ref 0.1–1.0)
Monocytes Relative: 5 % (ref 3–12)
Neutro Abs: 7.7 10*3/uL (ref 1.7–7.7)
Neutrophils Relative %: 82 % — ABNORMAL HIGH (ref 43–77)
PLATELETS: 279 10*3/uL (ref 150–400)
RBC: 4.75 MIL/uL (ref 4.22–5.81)
RDW: 12.9 % (ref 11.5–15.5)
WBC: 9.4 10*3/uL (ref 4.0–10.5)

## 2014-02-22 LAB — URINE MICROSCOPIC-ADD ON

## 2014-02-22 MED ORDER — KETOROLAC TROMETHAMINE 30 MG/ML IJ SOLN
30.0000 mg | Freq: Once | INTRAMUSCULAR | Status: AC
Start: 2014-02-22 — End: 2014-02-22
  Administered 2014-02-22: 30 mg via INTRAVENOUS
  Filled 2014-02-22: qty 1

## 2014-02-22 MED ORDER — HYDROCODONE-ACETAMINOPHEN 5-325 MG PO TABS: ORAL_TABLET | ORAL | Status: DC

## 2014-02-22 MED ORDER — METOCLOPRAMIDE HCL 5 MG/ML IJ SOLN
10.0000 mg | Freq: Once | INTRAMUSCULAR | Status: AC
Start: 2014-02-22 — End: 2014-02-22
  Administered 2014-02-22: 10 mg via INTRAVENOUS
  Filled 2014-02-22: qty 2

## 2014-02-22 MED ORDER — ONDANSETRON HCL 4 MG PO TABS: 4.0000 mg | ORAL_TABLET | Freq: Four times a day (QID) | ORAL | Status: DC

## 2014-02-22 MED ORDER — SODIUM CHLORIDE 0.9 % IV SOLN
Freq: Once | INTRAVENOUS | Status: AC
  Administered 2014-02-22: 10:00:00 via INTRAVENOUS

## 2014-02-22 NOTE — Discharge Instructions (Signed)
Kidney Stones Kidney stones (urolithiasis) are solid masses that form inside your kidneys. The intense pain is caused by the stone moving through the kidney, ureter, bladder, and urethra (urinary tract). When the stone moves, the ureter starts to spasm around the stone. The stone is usually passed in your pee (urine).  HOME CARE  Drink enough fluids to keep your pee clear or pale yellow. This helps to get the stone out.  Strain all pee through the provided strainer. Do not pee without peeing through the strainer, not even once. If you pee the stone out, catch it in the strainer. The stone may be as small as a grain of salt. Take this to your doctor. This will help your doctor figure out what you can do to try to prevent more kidney stones.  Only take medicine as told by your doctor.  Follow up with your doctor as told.  Get follow-up X-rays as told by your doctor. GET HELP IF: You have pain that gets worse even if you have been taking pain medicine. GET HELP RIGHT AWAY IF:   Your pain does not get better with medicine.  You have a fever or shaking chills.  Your pain increases and gets worse over 18 hours.  You have new belly (abdominal) pain.  You feel faint or pass out.  You are unable to pee. MAKE SURE YOU:   Understand these instructions.  Will watch your condition.  Will get help right away if you are not doing well or get worse. Document Released: 05/20/2008 Document Revised: 08/04/2013 Document Reviewed: 05/05/2013 ExitCare Patient Information 2014 [employer].  

## 2014-02-22 NOTE — ED Notes (Signed)
Pt c/o right lower quad abd pain that radiates around to right flank area with nausea that started this am, last bowel movement was this am and "normal" per pt.

## 2014-02-22 NOTE — ED Provider Notes (Signed)
CSN: [id number]     Arrival date & time 02/22/14  0900 History   First MD Initiated Contact with Patient 02/22/14 0930     Chief Complaint  Patient presents with  . Abdominal Pain     (Consider location/radiation/quality/duration/timing/severity/associated sxs/prior Treatment) Patient is a 73 y.o. male presenting with flank pain. The history is provided by the patient.  Flank Pain This is a new problem. Episode onset: several hrs prior to Ed arrival. The problem occurs constantly. The problem has been unchanged. Associated symptoms include abdominal pain, nausea and urinary symptoms. Pertinent negatives include no anorexia, chest pain, chills, fever, headaches, neck pain, numbness, rash, swollen glands, vomiting or weakness. Nothing aggravates the symptoms. He has tried nothing for the symptoms. The treatment provided no relief.   Patient c/o right flank pain of sudden onset this morning.  States the pain radiates to the lwer right abdomen.  Describes pain as sharp and associated with nausea.  Pt reports hx of kidney stones "years ago".  He also describes urinating normally , but dark colored.  He denies chest pain, SOB, vomiting or diarrhea.   Past Medical History  Diagnosis Date  . Coronary atherosclerosis of native coronary artery     DES RCA 11/09 (Dr. Terrence Dupont)  . Myocardial infarction     NSTEMI 11/09  . Mixed hyperlipidemia   . Essential hypertension, benign    Past Surgical History  Procedure Laterality Date  . Exploratory laparotomy  2007    Small bowel obstruction  . Resection of pilonidal cyst     Family History  Problem Relation Age of Onset  . Coronary artery disease Father   . Coronary artery disease Mother   . Diabetes type II Sister    History  Substance Use Topics  . Smoking status: Former Smoker -- 0.50 packs/day for 10 years    Types: Cigarettes    Quit date: 12/16/1965  . Smokeless tobacco: Never Used     Comment: Has not smoked since 41 yrs.  . Alcohol  Use: No    Review of Systems  Constitutional: Negative for fever, chills and appetite change.  Respiratory: Negative for chest tightness and shortness of breath.   Cardiovascular: Negative for chest pain.  Gastrointestinal: Positive for nausea and abdominal pain. Negative for vomiting, diarrhea, blood in stool and anorexia.  Genitourinary: Positive for flank pain. Negative for dysuria, urgency, frequency, penile swelling, scrotal swelling, difficulty urinating and testicular pain.  Musculoskeletal: Negative for back pain and neck pain.  Skin: Negative for color change and rash.  Neurological: Negative for dizziness, weakness, numbness and headaches.  Hematological: Negative for adenopathy.  All other systems reviewed and are negative.      Allergies  Review of patient's allergies indicates no known allergies.  Home Medications   Current Outpatient Rx  Name  Route  Sig  Dispense  Refill  . aspirin EC 81 MG tablet   Oral   Take 81 mg by mouth daily.         Marland Kitchen atorvastatin (LIPITOR) 40 MG tablet   Oral   Take 40 mg by mouth daily.           . clopidogrel (PLAVIX) 75 MG tablet   Oral   Take 1 tablet (75 mg total) by mouth daily.   90 tablet   3   . lisinopril-hydrochlorothiazide (PRINZIDE,ZESTORETIC) 20-12.5 MG per tablet   Oral   Take 1 tablet by mouth daily.          Marland Kitchen  metoprolol succinate (TOPROL-XL) 25 MG 24 hr tablet   Oral   Take 1 tablet (25 mg total) by mouth daily.   30 tablet   6   . Multiple Vitamins-Minerals (DAILY MENS HEALTH FORMULA PO)   Oral   Take 1 tablet by mouth daily.          . nitroGLYCERIN (NITROSTAT) 0.4 MG SL tablet   Sublingual   Place 1 tablet (0.4 mg total) under the tongue every 5 (five) minutes as needed for chest pain.   25 tablet   6    BP 160/81  Pulse 65  Temp(Src) 97.5 F (36.4 C) (Oral)  Resp 18  Ht 5\' 10"  (1.778 m)  Wt 180 lb (81.647 kg)  BMI 25.83 kg/m2  SpO2 98% Physical Exam  Nursing note and vitals  reviewed. Constitutional: He is oriented to person, place, and time. He appears well-developed and well-nourished. No distress.  HENT:  Head: Normocephalic and atraumatic.  Mouth/Throat: Oropharynx is clear and moist.  Cardiovascular: Normal rate, regular rhythm, normal heart sounds and intact distal pulses.   No murmur heard. Pulmonary/Chest: Effort normal and breath sounds normal. No respiratory distress.  Abdominal: Soft. Normal appearance and bowel sounds are normal. He exhibits no distension and no mass. There is no tenderness. There is no rebound, no guarding and no CVA tenderness.  Pt points to right flank and RLQ as area of pain.  No CVA tenderness, guarding or rebound tenderness on exam.    Musculoskeletal: Normal range of motion. He exhibits no edema.  Neurological: He is alert and oriented to person, place, and time. He exhibits normal muscle tone. Coordination normal.  Skin: Skin is warm and dry.    ED Course  Procedures (including critical care time) Labs Review Labs Reviewed  URINALYSIS, ROUTINE W REFLEX MICROSCOPIC - Abnormal; Notable for the following:    Color, Urine BROWN (*)    APPearance HAZY (*)    Specific Gravity, Urine >1.030 (*)    Hgb urine dipstick LARGE (*)    Bilirubin Urine SMALL (*)    Protein, ur 100 (*)    All other components within normal limits  CBC WITH DIFFERENTIAL - Abnormal; Notable for the following:    Neutrophils Relative % 82 (*)    All other components within normal limits  COMPREHENSIVE METABOLIC PANEL - Abnormal; Notable for the following:    Glucose, Bld 128 (*)    GFR calc non Af Amer 81 (*)    All other components within normal limits  URINE MICROSCOPIC-ADD ON - Abnormal; Notable for the following:    Bacteria, UA FEW (*)    All other components within normal limits   Imaging Review Ct Abdomen Pelvis Wo Contrast  02/22/2014   CLINICAL DATA:  Right lower quadrant and right flank discomfort with nausea no change in bowel habits   EXAM: CT ABDOMEN AND PELVIS WITHOUT CONTRAST  TECHNIQUE: Multidetector CT imaging of the abdomen and pelvis was performed following the standard protocol without intravenous contrast.  COMPARISON:  CT ABDOMEN W/ CM dated 11/03/2006  FINDINGS: There is moderate hydronephrosis and hydroureter on the right secondary to a calcified 7 x 4 mm diameter ureteropelvic junction stone. There are additional stones in the renal collecting systems measuring 1-2 mm in diameter. On the left there is no evidence of obstruction. There are stable hypodensities in the left mid and lower poles compatible with cysts. There are nonobstructing 1-2 mm diameter stones in the left renal calyces. No ureteral  stones are demonstrated. The partially distended urinary bladder is normal in appearance.  There are hypodensities in the liver which are stable compatible with cysts. There is no intrahepatic ductal dilation. The gallbladder is normal in appearance. The pancreas, spleen, nondistended stomach, adrenal glands, and abdominal aorta exhibit no acute abnormalities. The unopacified loops of small and large bowel exhibit no evidence of ileus or obstruction or inflammation. A normal calibered, partially gas-filled appendix is demonstrated. There is sigmoid diverticulosis without evidence of acute diverticulitis.  The prostate gland is mildly enlarged and contains multiple calcifications. The seminal vesicles are normal in appearance. There are bilateral inguinal hernias containing fat. There is no free fluid in the pelvis. The psoas musculature is normal in appearance.  The lumbar spine exhibits degenerative disc change at multiple levels. The bony pelvis exhibits no acute abnormality. The lung bases exhibit minimal compressive atelectasis.  IMPRESSION: 1. There is moderate right-sided hydronephrosis secondary to a 7 mm diameter ureteropelvic junction stone. There other nonobstructing stones in both kidneys measuring up to 2 mm in diameter. There  are stable cysts in the left kidney. 2. There are cysts in the liver. No acute hepatobiliary abnormality is demonstrated. 3. No acute bowel abnormality is demonstrated. There small bilateral fat-containing inguinal hernias. 4. There are degenerative changes of the lumbar discs at multiple levels.   Electronically Signed   By: David  Martinique   On: 02/22/2014 10:54     EKG Interpretation None      MDM   Final diagnoses:  Kidney stone on right side   Patient with sudden onset of RLQ and flank pain.  Hx of kidney stones.    Pain resolved after toradol.  VSS.  Pt well appearing  1:16 PM consulted Dr. Ranae Plumber.  Requested pt to have KUB prior to d/c.  He will see pt in his office tomorrow.  Pt verbalized understanding to plan and agrees.  Appears stable for d/c  The patient appears reasonably screened and/or stabilized for discharge and I doubt any other medical condition or other [facility] requiring further screening, evaluation, or treatment in the ED at this time prior to discharge.   Chelsey Redondo L. Vanessa , PA-C 02/24/14 1955

## 2014-02-24 NOTE — ED Provider Notes (Signed)
Medical screening examination/treatment/procedure(s) were performed by non-physician practitioner and as supervising physician I was immediately available for consultation/collaboration.   EKG Interpretation None        Maudry Diego, MD 02/24/14 2246

## 2014-03-01 ENCOUNTER — Ambulatory Visit (HOSPITAL_COMMUNITY)
Admission: RE | Admit: 2014-03-01 | Discharge: 2014-03-01 | Disposition: A | Discharge status: Home / Self Care | Payer: Medicare Other | Source: Ambulatory Visit | Attending: Urology | Admitting: Urology

## 2014-03-01 ENCOUNTER — Other Ambulatory Visit (HOSPITAL_COMMUNITY): Payer: Self-pay | Admitting: Urology

## 2014-03-01 DIAGNOSIS — N200 Calculus of kidney: Secondary | ICD-10-CM

## 2014-03-02 ENCOUNTER — Telehealth: Payer: Self-pay | Admitting: Cardiology

## 2014-03-02 NOTE — Telephone Encounter (Signed)
Patient seen in November 2014. He has been on long-term DAPT, mainly by his preference. Last DES intervention was in 2009. He should be able to hold both aspirin and Plavix one week prior to lithotripsy, can resume thereafter if okay with urologist.

## 2014-03-02 NOTE — Telephone Encounter (Signed)
Patient needs Lithotripsy scheduled for next Wednesday 3/25.  Needs to know when patient is to stop ASA and Plavix.  Please fax to [phone]. / tgs

## 2014-03-02 NOTE — Telephone Encounter (Signed)
Last office visit 06-2013, will forward for dr Domenic Polite review

## 2014-03-02 NOTE — Telephone Encounter (Signed)
Will fax this note to the number provided. 

## 2014-03-18 ENCOUNTER — Emergency Department (HOSPITAL_COMMUNITY)
Admission: EM | Admit: 2014-03-18 | Discharge: 2014-03-18 | Disposition: A | Discharge status: Home / Self Care | Payer: Medicare Other | Attending: Emergency Medicine | Admitting: Emergency Medicine

## 2014-03-18 ENCOUNTER — Encounter (HOSPITAL_COMMUNITY): Payer: Self-pay | Admitting: Emergency Medicine

## 2014-03-18 DIAGNOSIS — Z79899 Other long term (current) drug therapy: Secondary | ICD-10-CM | POA: Insufficient documentation

## 2014-03-18 DIAGNOSIS — E782 Mixed hyperlipidemia: Secondary | ICD-10-CM | POA: Insufficient documentation

## 2014-03-18 DIAGNOSIS — I2510 Atherosclerotic heart disease of native coronary artery without angina pectoris: Secondary | ICD-10-CM | POA: Insufficient documentation

## 2014-03-18 DIAGNOSIS — Z87891 Personal history of nicotine dependence: Secondary | ICD-10-CM | POA: Insufficient documentation

## 2014-03-18 DIAGNOSIS — I10 Essential (primary) hypertension: Secondary | ICD-10-CM | POA: Insufficient documentation

## 2014-03-18 DIAGNOSIS — I252 Old myocardial infarction: Secondary | ICD-10-CM | POA: Insufficient documentation

## 2014-03-18 DIAGNOSIS — Z7982 Long term (current) use of aspirin: Secondary | ICD-10-CM | POA: Insufficient documentation

## 2014-03-18 DIAGNOSIS — R319 Hematuria, unspecified: Secondary | ICD-10-CM

## 2014-03-18 LAB — URINE MICROSCOPIC-ADD ON

## 2014-03-18 LAB — BASIC METABOLIC PANEL
BUN: 12 mg/dL (ref 6–23)
CALCIUM: 9.4 mg/dL (ref 8.4–10.5)
CO2: 29 mEq/L (ref 19–32)
Chloride: 102 mEq/L (ref 96–112)
Creatinine, Ser: 1.03 mg/dL (ref 0.50–1.35)
GFR calc Af Amer: 82 mL/min — ABNORMAL LOW (ref >90)
GFR, EST NON AFRICAN AMERICAN: 71 mL/min — AB (ref >90)
Glucose, Bld: 104 mg/dL — ABNORMAL HIGH (ref 70–99)
Potassium: 3.8 mEq/L (ref 3.7–5.3)
SODIUM: 142 meq/L (ref 137–147)

## 2014-03-18 LAB — URINALYSIS, ROUTINE W REFLEX MICROSCOPIC
Bilirubin Urine: NEGATIVE
Glucose, UA: NEGATIVE mg/dL
KETONES UR: NEGATIVE mg/dL
Leukocytes, UA: NEGATIVE
NITRITE: NEGATIVE
UROBILINOGEN UA: 0.2 mg/dL (ref 0.0–1.0)
pH: 5.5 (ref 5.0–8.0)

## 2014-03-18 LAB — CBC
HCT: 40.8 % (ref 39.0–52.0)
Hemoglobin: 13.7 g/dL (ref 13.0–17.0)
MCH: 29.6 pg (ref 26.0–34.0)
MCHC: 33.6 g/dL (ref 30.0–36.0)
MCV: 88.1 fL (ref 78.0–100.0)
PLATELETS: 261 10*3/uL (ref 150–400)
RBC: 4.63 MIL/uL (ref 4.22–5.81)
RDW: 12.7 % (ref 11.5–15.5)
WBC: 6.5 10*3/uL (ref 4.0–10.5)

## 2014-03-18 MED ORDER — CIPROFLOXACIN HCL 500 MG PO TABS: 500.0000 mg | ORAL_TABLET | Freq: Two times a day (BID) | ORAL | Status: DC

## 2014-03-18 NOTE — ED Notes (Signed)
Patient given discharge instruction, verbalized understand. Patient ambulatory out of the department.  

## 2014-03-18 NOTE — ED Notes (Signed)
Pt states he noticed dark blood in urine this morning, pt see javaid, pt states he had a kidney stone that required lithotripsy 2 weeks prior. Denies pain at this time.

## 2014-03-18 NOTE — Discharge Instructions (Signed)
Hematuria, Adult °Hematuria is blood in your urine. It can be caused by a bladder infection, kidney infection, prostate infection, kidney stone, or cancer of your urinary tract. Infections can usually be treated with medicine, and a kidney stone usually will pass through your urine. If neither of these is the cause of your hematuria, further workup to find out the reason may be needed. °It is very important that you tell your health care provider about any blood you see in your urine, even if the blood stops without treatment or happens without causing pain. Blood in your urine that happens and then stops and then happens again can be a symptom of a very serious condition. Also, pain is not a symptom in the initial stages of many urinary cancers. °HOME CARE INSTRUCTIONS  °· Drink lots of fluid, 3 4 quarts a day. If you have been diagnosed with an infection, cranberry juice is especially recommended, in addition to large amounts of water. °· Avoid caffeine, tea, and carbonated beverages, because they tend to irritate the bladder. °· Avoid alcohol because it may irritate the prostate. °· Only take over-the-counter or prescription medicines for pain, discomfort, or fever as directed by your health care provider. °· If you have been diagnosed with a kidney stone, follow your health care provider's instructions regarding straining your urine to catch the stone. °· Empty your bladder often. Avoid holding urine for long periods of time. °· After a bowel movement, women should cleanse front to back. Use each tissue only once. °· Empty your bladder before and after sexual intercourse if you are a male. °SEEK MEDICAL CARE IF: °You develop back pain, fever, a feeling of sickness in your stomach (nausea), or vomiting or if your symptoms are not better in 3 days. Return sooner if you are getting worse. °SEEK IMMEDIATE MEDICAL CARE IF:  °· You have a persistent fever, with a temperature of 101.8°F (38.8°C) or greater. °· You  develop severe vomiting and are unable to keep the medicine down. °· You develop severe back or abdominal pain despite taking your medicines. °· You begin passing a large amount of blood or clots in your urine. °· You feel extremely weak or faint, or you pass out. °MAKE SURE YOU:  °· Understand these instructions. °· Will watch your condition. °· Will get help right away if you are not doing well or get worse. °Document Released: 12/02/2005 Document Revised: 09/22/2013 Document Reviewed: 08/02/2013 °ExitCare® Patient Information ©2014 [employer]. ° °

## 2014-03-18 NOTE — ED Provider Notes (Signed)
CSN: [id number]     Arrival date & time 03/18/14  1503 History  This chart was scribed for Leota Jacobsen, MD by Zettie Pho, ED Scribe. This patient was seen in room APA01/APA01 and the patient's care was started at 4:55 PM.    Chief Complaint  Patient presents with  . Hematuria    The history is provided by the patient. No language interpreter was used.   HPI Comments: Montavius Subramaniam is a 73 y.o. Male with a history of nephrolithiasis with lithotripsy about a week ago who presents to the Emergency Department complaining of hematuria with an associated pressure-like pain to the lower abdomen onset this morning. Patient reports that he is not currently taking antibiotics. He denies difficulty urinating/decreased urine volume, emesis, fever. Patient has a history of coronary atherosclerosis of native coronary artery, MI, mixed hyperlipidemia, and benign essential HTN.   Urologist- Dr. Michela Pitcher   Past Medical History  Diagnosis Date  . Coronary atherosclerosis of native coronary artery     DES RCA 11/09 (Dr. Terrence Dupont)  . Myocardial infarction     NSTEMI 11/09  . Mixed hyperlipidemia   . Essential hypertension, benign    Past Surgical History  Procedure Laterality Date  . Exploratory laparotomy  2007    Small bowel obstruction  . Resection of pilonidal cyst     Family History  Problem Relation Age of Onset  . Coronary artery disease Father   . Coronary artery disease Mother   . Diabetes type II Sister    History  Substance Use Topics  . Smoking status: Former Smoker -- 0.50 packs/day for 10 years    Types: Cigarettes    Quit date: 12/16/1965  . Smokeless tobacco: Never Used     Comment: Has not smoked since 41 yrs.  . Alcohol Use: No    Review of Systems  Constitutional: Negative for fever.  Gastrointestinal: Positive for abdominal pain. Negative for vomiting.  Genitourinary: Positive for hematuria. Negative for decreased urine volume and difficulty urinating.  All other  systems reviewed and are negative.      Allergies  Review of patient's allergies indicates no known allergies.  Home Medications   Current Outpatient Rx  Name  Route  Sig  Dispense  Refill  . aspirin EC 81 MG tablet   Oral   Take 81 mg by mouth daily.         Marland Kitchen atorvastatin (LIPITOR) 40 MG tablet   Oral   Take 40 mg by mouth daily.           . clopidogrel (PLAVIX) 75 MG tablet   Oral   Take 1 tablet (75 mg total) by mouth daily.   90 tablet   3   . HYDROcodone-acetaminophen (NORCO/VICODIN) 5-325 MG per tablet      Take one-two tabs po q 4-6 hrs prn pain   24 tablet   0   . lisinopril-hydrochlorothiazide (PRINZIDE,ZESTORETIC) 20-12.5 MG per tablet   Oral   Take 1 tablet by mouth daily.          . metoprolol succinate (TOPROL-XL) 25 MG 24 hr tablet   Oral   Take 1 tablet (25 mg total) by mouth daily.   30 tablet   6   . Multiple Vitamins-Minerals (DAILY MENS HEALTH FORMULA PO)   Oral   Take 1 tablet by mouth daily.          . nitroGLYCERIN (NITROSTAT) 0.4 MG SL tablet   Sublingual   Place  1 tablet (0.4 mg total) under the tongue every 5 (five) minutes as needed for chest pain.   25 tablet   6   . ondansetron (ZOFRAN) 4 MG tablet   Oral   Take 1 tablet (4 mg total) by mouth every 6 (six) hours.   12 tablet   0    Triage Vitals: BP 168/95  Pulse 67  Temp(Src) 97.9 F (36.6 C) (Oral)  Resp 18  Ht 5\' 9"  (1.753 m)  Wt 180 lb (81.647 kg)  BMI 26.57 kg/m2  SpO2 98%  Physical Exam  Nursing note and vitals reviewed. Constitutional: He is oriented to person, place, and time. He appears well-developed and well-nourished.  Non-toxic appearance. No distress.  HENT:  Head: Normocephalic and atraumatic.  Eyes: Conjunctivae, EOM and lids are normal. Pupils are equal, round, and reactive to light.  Neck: Normal range of motion. Neck supple. No tracheal deviation present. No mass present.  Cardiovascular: Normal rate, regular rhythm and normal heart  sounds.  Exam reveals no gallop.   No murmur heard. Pulmonary/Chest: Effort normal and breath sounds normal. No stridor. No respiratory distress. He has no decreased breath sounds. He has no wheezes. He has no rhonchi. He has no rales.  Abdominal: Soft. Normal appearance and bowel sounds are normal. He exhibits no distension. There is no tenderness. There is no rebound and no CVA tenderness.  Musculoskeletal: Normal range of motion. He exhibits no edema and no tenderness.  Neurological: He is alert and oriented to person, place, and time. He has normal strength. No cranial nerve deficit or sensory deficit. GCS eye subscore is 4. GCS verbal subscore is 5. GCS motor subscore is 6.  Skin: Skin is warm and dry. No abrasion and no rash noted.  Psychiatric: He has a normal mood and affect. His speech is normal and behavior is normal.    ED Course  Procedures (including critical care time)  DIAGNOSTIC STUDIES: Oxygen Saturation is 98% on room air, normal by my interpretation.    COORDINATION OF CARE: 4:59 PM- Ordered UA, CBC, BMP. Discussed that UA indicate a large amount of blood in the urine, but is negative for infection and labs are otherwise negative. Will start patient on a course of antibiotics. Advised patient to follow up with his urologist. Return precautions given. Discussed treatment plan with patient at bedside and patient verbalized agreement.     Labs Review Labs Reviewed  URINALYSIS, ROUTINE W REFLEX MICROSCOPIC - Abnormal; Notable for the following:    Specific Gravity, Urine <1.005 (*)    Hgb urine dipstick LARGE (*)    Protein, ur TRACE (*)    All other components within normal limits  BASIC METABOLIC PANEL - Abnormal; Notable for the following:    Glucose, Bld 104 (*)    GFR calc non Af Amer 71 (*)    GFR calc Af Amer 82 (*)    All other components within normal limits  URINE MICROSCOPIC-ADD ON - Abnormal; Notable for the following:    Bacteria, UA FEW (*)    All other  components within normal limits  CBC   Imaging Review No results found.   EKG Interpretation None      MDM   Final diagnoses:  None   Patient to be treated for her suspected early UTI. He has no evidence of ureteral obstruction. Was given return precautions.       Leota Jacobsen, MD 03/18/14 [phone]

## 2014-04-15 ENCOUNTER — Telehealth: Payer: Self-pay | Admitting: *Deleted

## 2014-04-15 MED ORDER — METOPROLOL SUCCINATE ER 25 MG PO TB24: 25.0000 mg | ORAL_TABLET | Freq: Every day | ORAL | Status: DC

## 2014-04-15 NOTE — Telephone Encounter (Signed)
METOPROLOL  SUCC ER 25MG 

## 2014-04-15 NOTE — Telephone Encounter (Signed)
Medication sent via escribe.  

## 2014-05-17 ENCOUNTER — Ambulatory Visit (INDEPENDENT_AMBULATORY_CARE_PROVIDER_SITE_OTHER): Payer: Medicare Other | Admitting: Cardiology

## 2014-05-17 ENCOUNTER — Encounter: Payer: Self-pay | Admitting: Cardiology

## 2014-05-17 VITALS — BP 159/82 | HR 62 | Ht 70.0 in | Wt 185.1 lb

## 2014-05-17 DIAGNOSIS — E782 Mixed hyperlipidemia: Secondary | ICD-10-CM

## 2014-05-17 DIAGNOSIS — I2510 Atherosclerotic heart disease of native coronary artery without angina pectoris: Secondary | ICD-10-CM

## 2014-05-17 DIAGNOSIS — I10 Essential (primary) hypertension: Secondary | ICD-10-CM

## 2014-05-17 MED ORDER — METOPROLOL SUCCINATE ER 25 MG PO TB24: 25.0000 mg | ORAL_TABLET | Freq: Every day | ORAL | Status: DC

## 2014-05-17 NOTE — Assessment & Plan Note (Signed)
Blood pressure elevated today. Continue medical therapy and keep followup with Dr. Quillian Quince.

## 2014-05-17 NOTE — Assessment & Plan Note (Signed)
Symptomatically stable on medical therapy. ECG reviewed and stable. Continue observation.

## 2014-05-17 NOTE — Patient Instructions (Signed)

## 2014-05-17 NOTE — Assessment & Plan Note (Signed)
He continues on Lipitor, regular lipid followup is with Dr. Quillian Quince.

## 2014-05-17 NOTE — Progress Notes (Signed)
    Clinical Summary Mr. Stucki is a 73 y.o.male last seen in November 2014. He continues to do well, reports no angina symptoms or progressive shortness of breath. He did have a kidney stone since I last saw him, underwent a lithotripsy off aspirin and Plavix successfully.  Lab work from April of this year showed hemoglobin 13.7, platelets 261, potassium 3.8, BUN 12, creatinine 1.0.  ECG today shows sinus bradycardia with left anterior fascicular block and right bundle branch block.  No Known Allergies  Current Outpatient Prescriptions  Medication Sig Dispense Refill  . aspirin EC 81 MG tablet Take 81 mg by mouth daily.      Marland Kitchen atorvastatin (LIPITOR) 40 MG tablet Take 40 mg by mouth daily.        . clopidogrel (PLAVIX) 75 MG tablet Take 1 tablet (75 mg total) by mouth daily.  90 tablet  3  . lisinopril-hydrochlorothiazide (PRINZIDE,ZESTORETIC) 20-12.5 MG per tablet Take 1 tablet by mouth daily.       . metoprolol succinate (TOPROL-XL) 25 MG 24 hr tablet Take 1 tablet (25 mg total) by mouth daily.  30 tablet  6  . Multiple Vitamins-Minerals (DAILY MENS HEALTH FORMULA PO) Take 1 tablet by mouth daily.       . nitroGLYCERIN (NITROSTAT) 0.4 MG SL tablet Place 1 tablet (0.4 mg total) under the tongue every 5 (five) minutes as needed for chest pain.  25 tablet  6   No current facility-administered medications for this visit.    Past Medical History  Diagnosis Date  . Coronary atherosclerosis of native coronary artery     DES RCA 11/09 (Dr. Terrence Dupont)  . Myocardial infarction     NSTEMI 11/09  . Mixed hyperlipidemia   . Essential hypertension, benign     Social History Mr. Ishler reports that he quit smoking about 48 years ago. His smoking use included Cigarettes. He has a 5 pack-year smoking history. He has never used smokeless tobacco. Mr. Ohlin reports that he does not drink alcohol.  Review of Systems No palpitations or syncope. No claudication. No unusual bleeding problems on  DAPT. Otherwise outlined.  Physical Examination Filed Vitals:   05/17/14 1610  BP: 159/82  Pulse: 62   Filed Weights   05/17/14 1610  Weight: 185 lb 1.9 oz (83.97 kg)    Appears comfortable at rest.  HEENT: Conjunctiva and lids normal, oropharynx with moist mucosa.  Neck: Supple, no elevated JVP or carotid bruits, no thyromegaly.  Lungs: Clear to auscultation, nonlabored.  Cardiac: Regular rate and rhythm, no S3 gallop or pericardial rub, no significant murmur.  Abdomen: Soft, nontender, no bruits, bowel sounds present.  Skin: Warm and dry, no ulcerations.  Extremities: No pitting edema, distal pulses 1-2+.  Musculoskeletal: No kyphosis. Osteoarthritic changes in the hands.  Neuropsychiatric: Alert and oriented x3, affect appropriate.   Problem List and Plan   Coronary atherosclerosis of native coronary artery Symptomatically stable on medical therapy. ECG reviewed and stable. Continue observation.  Essential hypertension, benign Blood pressure elevated today. Continue medical therapy and keep followup with Dr. Quillian Quince.  Mixed hyperlipidemia He continues on Lipitor, regular lipid followup is with Dr. Quillian Quince.    Satira Sark, M.D., F.A.C.C.

## 2014-09-19 ENCOUNTER — Encounter: Payer: Self-pay | Admitting: Cardiology

## 2014-09-19 ENCOUNTER — Ambulatory Visit (INDEPENDENT_AMBULATORY_CARE_PROVIDER_SITE_OTHER): Payer: Medicare Other | Admitting: Cardiology

## 2014-09-19 VITALS — BP 134/78 | HR 70 | Ht 70.0 in | Wt 186.0 lb

## 2014-09-19 DIAGNOSIS — I2510 Atherosclerotic heart disease of native coronary artery without angina pectoris: Secondary | ICD-10-CM

## 2014-09-19 DIAGNOSIS — I10 Essential (primary) hypertension: Secondary | ICD-10-CM

## 2014-09-19 DIAGNOSIS — E782 Mixed hyperlipidemia: Secondary | ICD-10-CM

## 2014-09-19 NOTE — Patient Instructions (Signed)
Your physician recommends that you schedule a follow-up appointment in: to be determined     Your physician has requested that you have a lexiscan myoview. For further information please visit [email or web address]. Please follow instruction sheet, as given. PLEASE TAKE ALL MEDICATIONS THAT DAY       Thank you for choosing [facility] !

## 2014-09-19 NOTE — Assessment & Plan Note (Signed)
The patient has been clinically stable from a cardiac perspective, although had a recent DOT physical with Dr. Andree Elk, and followup stress testing has been requested based on duration of time since his last coronary intervention. We are adding him on for a Lexiscan Cardiolite tomorrow morning on medical therapy (do not hold beta blocker) for assessment of ischemic burden. He is status post DES to the RCA in 2009, has been doing very well. Will plan to review results, inform the patient, and forward information on to Dr. Andree Elk. No change in medical therapy at this time.

## 2014-09-19 NOTE — Assessment & Plan Note (Signed)
No change to current antihypertensive regimen. 

## 2014-09-19 NOTE — Assessment & Plan Note (Signed)
On Lipitor, followed by Dr. Quillian Quince.

## 2014-09-19 NOTE — Progress Notes (Signed)
Clinical Summary [name] is a 73 y.o.male last seen in June. He is referred back to the office having had a recent DOT physical with David Harmon in [place]. 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 Stress testing is requested based on his cardiac history. He has done very well over the last 5 years, no angina symptoms, NYHA class I dyspnea.  He is status post DES to the RCA by Dr. Terrence Harmon back in November 2009. We have been managing him medically over the last few years. ECG from May showed sinus bradycardia with right bundle branch block and LVH.   No Known Allergies  Current Outpatient Prescriptions  Medication Sig Dispense Refill  . aspirin EC 81 MG tablet Take 81 mg by mouth daily.      Marland Kitchen atorvastatin (LIPITOR) 40 MG tablet Take 40 mg by mouth daily.        . clopidogrel (PLAVIX) 75 MG tablet Take 1 tablet (75 mg total) by mouth daily.  90 tablet  3  . lisinopril-hydrochlorothiazide (PRINZIDE,ZESTORETIC) 20-12.5 MG per tablet Take 1 tablet by mouth daily.       . metoprolol succinate (TOPROL-XL) 25 MG 24 hr tablet Take 1 tablet (25 mg total) by mouth daily.  90 tablet  3  . Multiple Vitamins-Minerals (DAILY MENS HEALTH FORMULA PO) Take 1 tablet by mouth daily.       . nitroGLYCERIN (NITROSTAT) 0.4 MG SL tablet Place 1 tablet (0.4 mg total) under the tongue every 5 (five) minutes as needed for chest pain.  25 tablet  6   No current facility-administered medications for this visit.    Past Medical History  Diagnosis Date  . Coronary atherosclerosis of native coronary artery     DES RCA 11/09 (Dr. Terrence Harmon)  . Myocardial infarction     NSTEMI 11/09  . Mixed hyperlipidemia   . Essential hypertension, benign     Social History David Harmon reports that he quit smoking about 48 years ago. His smoking use included Cigarettes. He has a 5 pack-year smoking history. He has never used  smokeless tobacco. David Harmon reports that he does not drink alcohol.  Review of Systems No palpitations, dizziness, syncope. Other systems reviewed and negative.  Physical Examination Filed Vitals:   09/19/14 1451  BP: 134/78  Pulse: 70   Filed Weights   09/19/14 1451  Weight: 186 lb (84.369 kg)    Appears comfortable at rest.  HEENT: Conjunctiva and lids normal, oropharynx with moist mucosa.  Neck: Supple, no elevated JVP or carotid bruits, no thyromegaly.  Lungs: Clear to auscultation, nonlabored.  Cardiac: Regular rate and rhythm, no S3 gallop or pericardial rub, no significant murmur.  Abdomen: Soft, nontender, no bruits, bowel sounds present.  Skin: Warm and dry, no ulcerations.  Extremities: No pitting edema, distal pulses 1-2+.  Musculoskeletal: No kyphosis. Osteoarthritic changes in the hands.  Neuropsychiatric: Alert and oriented x3, affect appropriate.   Problem List and Plan   Coronary atherosclerosis of native coronary artery The patient has been clinically stable from a cardiac perspective, although had a recent DOT physical with David Harmon, and followup stress testing has been requested based on duration of time since his last coronary intervention. We are adding him on for a Lexiscan Cardiolite tomorrow morning on medical therapy (do not hold beta blocker) for assessment of ischemic burden. He  is status post DES to the RCA in 2009, has been doing very well. Will plan to review results, inform the patient, and forward information on to David Harmon. No change in medical therapy at this time.  Mixed hyperlipidemia On Lipitor, followed by David Harmon.  Essential hypertension, benign No change to current antihypertensive regimen.    Satira Sark, M.D., F.A.C.C.

## 2014-09-20 ENCOUNTER — Encounter (HOSPITAL_COMMUNITY)
Admission: RE | Admit: 2014-09-20 | Discharge: 2014-09-20 | Disposition: A | Discharge status: Home / Self Care | Payer: Medicare Other | Source: Ambulatory Visit | Attending: Cardiology | Admitting: Cardiology

## 2014-09-20 ENCOUNTER — Ambulatory Visit (HOSPITAL_COMMUNITY)
Admission: RE | Admit: 2014-09-20 | Discharge: 2014-09-20 | Disposition: A | Discharge status: Home / Self Care | Payer: Medicare Other | Source: Ambulatory Visit | Attending: Cardiology | Admitting: Cardiology

## 2014-09-20 ENCOUNTER — Other Ambulatory Visit: Payer: Self-pay | Admitting: Cardiology

## 2014-09-20 ENCOUNTER — Encounter (HOSPITAL_COMMUNITY): Payer: Self-pay

## 2014-09-20 ENCOUNTER — Encounter (HOSPITAL_COMMUNITY)
Admission: RE | Admit: 2014-09-20 | Discharge: 2014-09-20 | Disposition: A | Discharge status: Home / Self Care | Payer: Medicare Other | Source: Ambulatory Visit | Attending: Cardiovascular Disease | Admitting: Cardiovascular Disease

## 2014-09-20 DIAGNOSIS — I2510 Atherosclerotic heart disease of native coronary artery without angina pectoris: Secondary | ICD-10-CM | POA: Insufficient documentation

## 2014-09-20 DIAGNOSIS — Z955 Presence of coronary angioplasty implant and graft: Secondary | ICD-10-CM | POA: Diagnosis not present

## 2014-09-20 DIAGNOSIS — Z021 Encounter for pre-employment examination: Secondary | ICD-10-CM | POA: Diagnosis not present

## 2014-09-20 MED ORDER — SODIUM CHLORIDE 0.9 % IJ SOLN
10.0000 mL | INTRAMUSCULAR | Status: DC | PRN
  Administered 2014-09-20: 10 mL via INTRAVENOUS

## 2014-09-20 MED ORDER — TECHNETIUM TC 99M SESTAMIBI - CARDIOLITE
10.0000 | Freq: Once | INTRAVENOUS | Status: AC | PRN
  Administered 2014-09-20: 10 via INTRAVENOUS

## 2014-09-20 MED ORDER — REGADENOSON 0.4 MG/5ML IV SOLN
0.4000 mg | Freq: Once | INTRAVENOUS | Status: AC | PRN
  Administered 2014-09-20: 0.4 mg via INTRAVENOUS

## 2014-09-20 MED ORDER — TECHNETIUM TC 99M SESTAMIBI GENERIC - CARDIOLITE
30.0000 | Freq: Once | INTRAVENOUS | Status: AC | PRN
  Administered 2014-09-20: 30 via INTRAVENOUS

## 2014-09-20 MED ORDER — SODIUM CHLORIDE 0.9 % IJ SOLN
INTRAMUSCULAR | Status: AC
  Administered 2014-09-20: 10 mL via INTRAVENOUS
  Filled 2014-09-20: qty 10

## 2014-09-20 MED ORDER — REGADENOSON 0.4 MG/5ML IV SOLN
INTRAVENOUS | Status: AC
  Administered 2014-09-20: 0.4 mg via INTRAVENOUS
  Filled 2014-09-20: qty 5

## 2014-09-20 NOTE — Progress Notes (Signed)
Stress Lab Nurses Notes - David Na  Stiles Harmon 09/20/2014 Reason for doing test: CAD and Physical Type of test: Wille Glaser Nurse performing test: Gerrit Halls, RN Nuclear Medicine Tech: Dyanne Carrel Echo Tech: Not Applicable MD performing test: Koneswaran/K.Lawrence NP Family PR:[id number]  Test explained and consent signed: Yes.   IV started: No redness or edema and Saline lock started in radiology Symptoms:Nauses & headache  Treatment/Intervention: None Reason test stopped: protocol completed After recovery IV was: Discontinued via X-ray tech and No redness or edema Patient to return to [name]. Med at : 9:00 Patient discharged: Home Patient's Condition upon discharge was: stable Comments: During test BP 140/80 & HR 80.  Recovery BP 140/87 & HR 72.  Symptoms resolved in recovery.  [name] T

## 2014-09-21 ENCOUNTER — Telehealth: Payer: Self-pay | Admitting: *Deleted

## 2014-09-21 NOTE — Telephone Encounter (Signed)
Message copied by Desma Mcgregor on Wed Sep 21, 2014 11:07 AM ------      Message from: MCDOWELL, Aloha Gell      Created: Wed Sep 21, 2014  9:14 AM       Reviewed report. Please let patient know that his stress test was normal. Please fax a copy of this appended report to Dr. Andree Elk in [place] for DOT clearance. No further cardiac testing indicated at this time. ------

## 2014-09-21 NOTE — Telephone Encounter (Signed)
Pt wife notified, faxed report and LOV to Dr. Andree Elk.

## 2014-11-23 ENCOUNTER — Ambulatory Visit: Payer: Medicare Other | Admitting: Cardiology

## 2015-01-13 ENCOUNTER — Other Ambulatory Visit: Payer: Self-pay | Admitting: Cardiology

## 2015-04-02 IMAGING — CT CT ABD-PELV W/O CM
2 of 3 series · 16 of 46 positions shown, 18 images · non-contrast
Comparison: CT ABDOMEN W/ CM dated 11/03/2006

CLINICAL DATA: Right lower quadrant and right flank discomfort with
nausea no change in bowel habits

EXAM:
CT ABDOMEN AND PELVIS WITHOUT CONTRAST
TECHNIQUE: Multidetector CT imaging of the abdomen and pelvis was performed
following the standard protocol without intravenous contrast.

[Series 2: standard/full over (age)lbs 5.0 · axial · 0.76mm/px · z∈[-432,-26]mm · 13 of 94 slices shown, 15 images]
[im 7/94  soft-tissue]
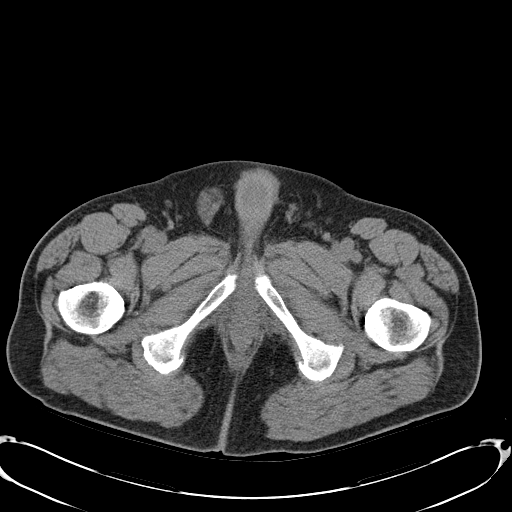
[im 7/94  bone]
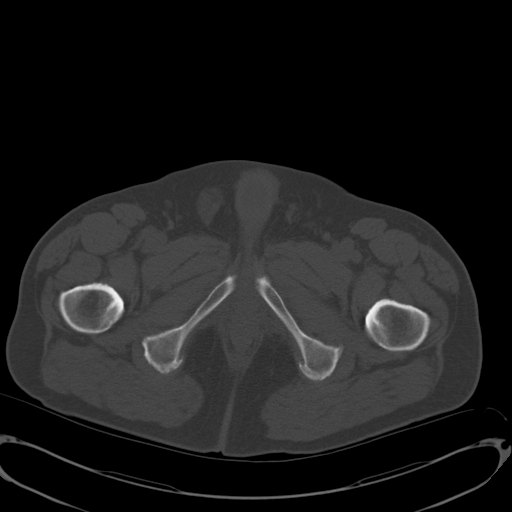
[im 13/94  soft-tissue]
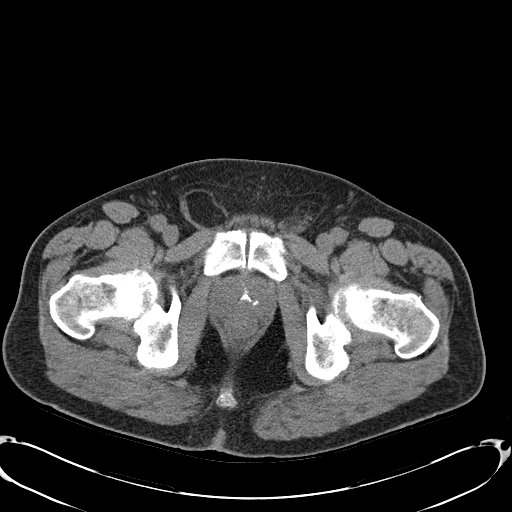
[im 19/94  soft-tissue]
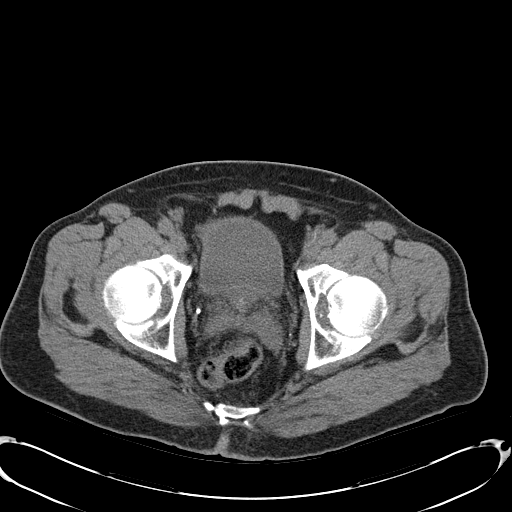
[im 28/94  soft-tissue]
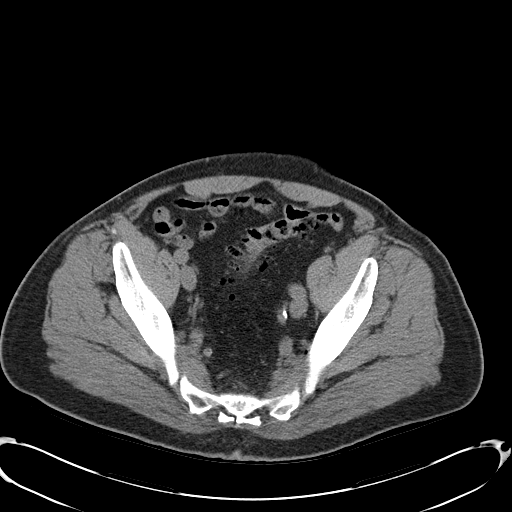
[im 34/94  soft-tissue]
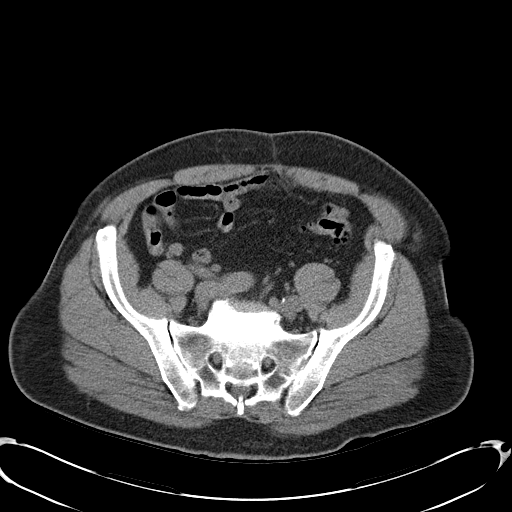
[im 40/94  soft-tissue]
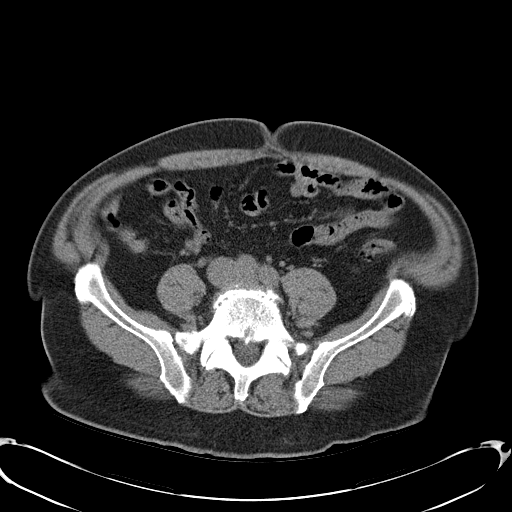
[im 49/94  soft-tissue]
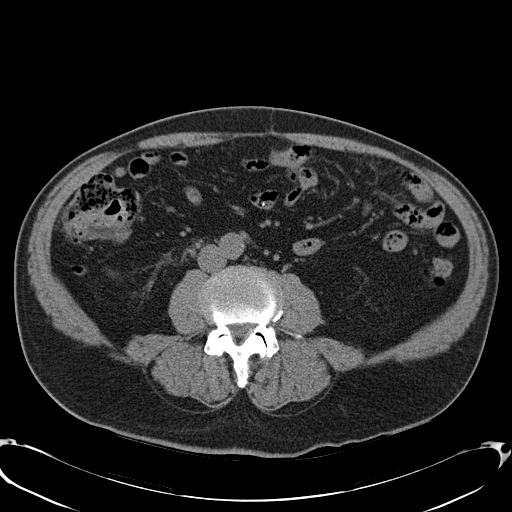
[im 55/94  soft-tissue]
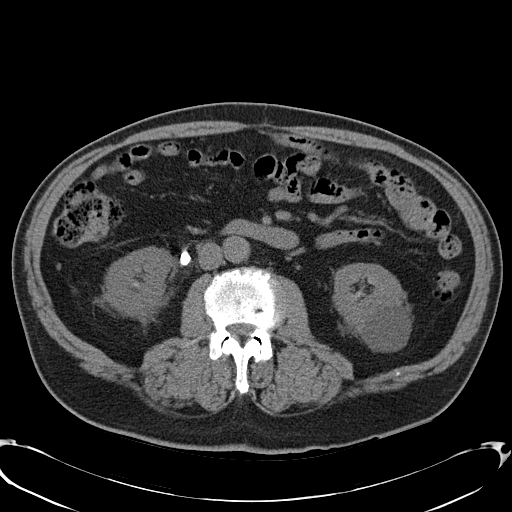
[im 61/94  soft-tissue]
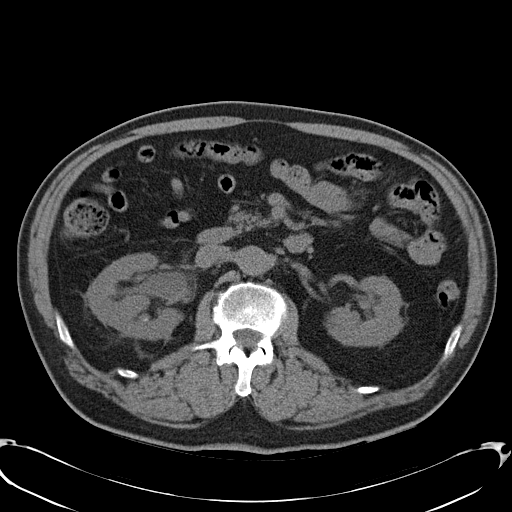
[im 61/94  bone]
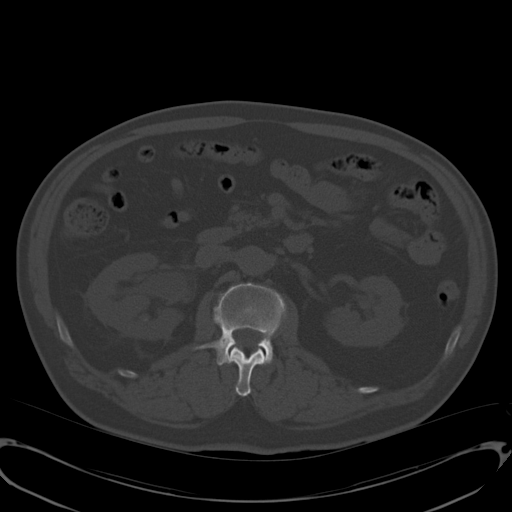
[im 67/94  soft-tissue]
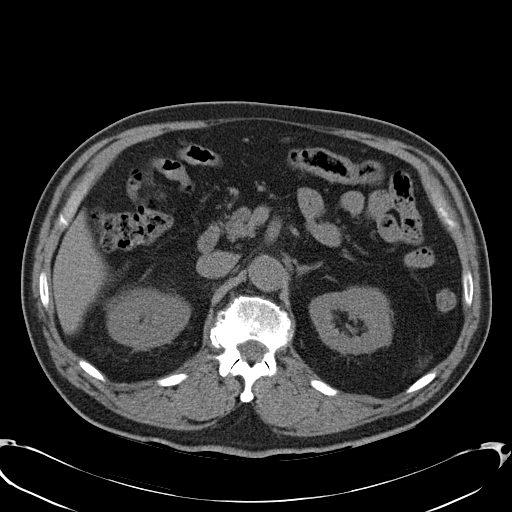
[im 76/94  soft-tissue]
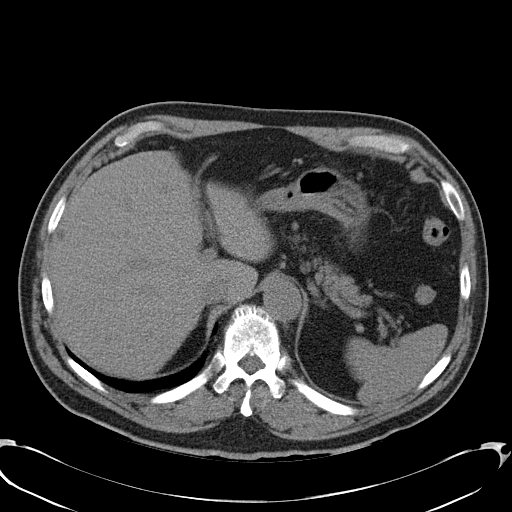
[im 82/94  soft-tissue]
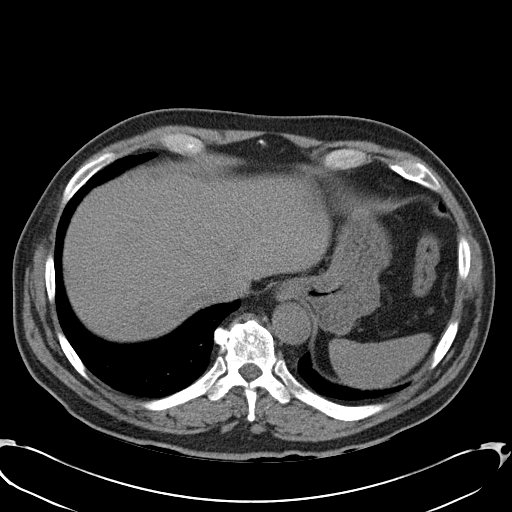
[im 88/94  soft-tissue]
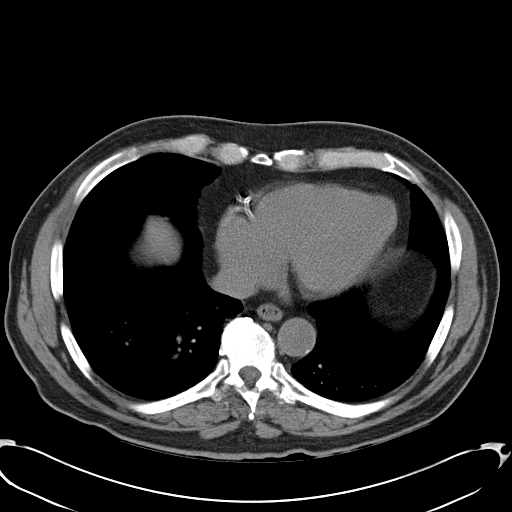

[Series 4: mpr coronal · coronal · 0.76mm/px · 3 of 89 slices shown]
[im 30/89  soft-tissue]
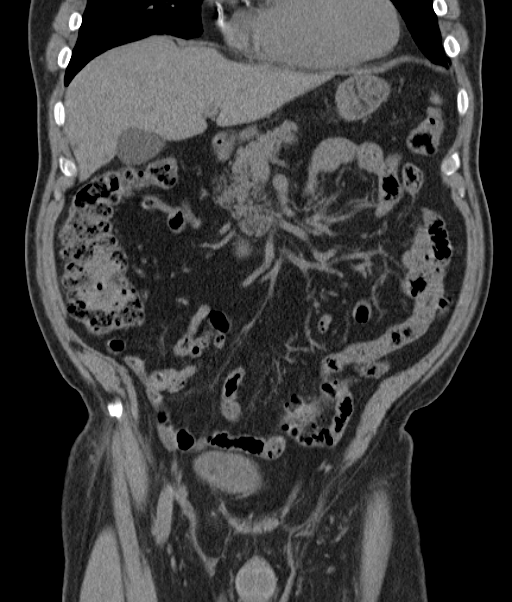
[im 40/89  soft-tissue]
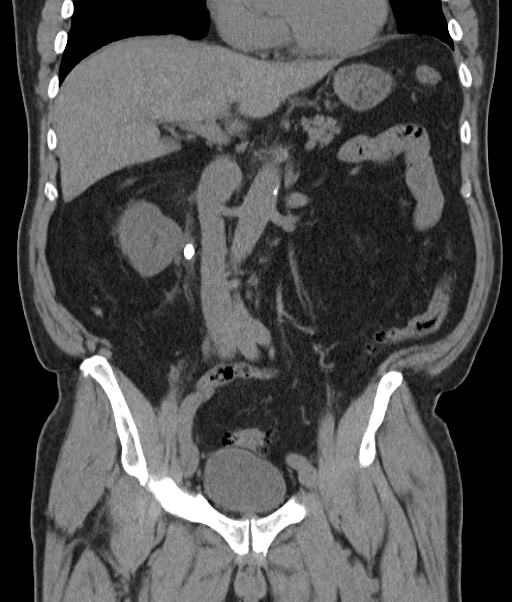
[im 49/89  soft-tissue]
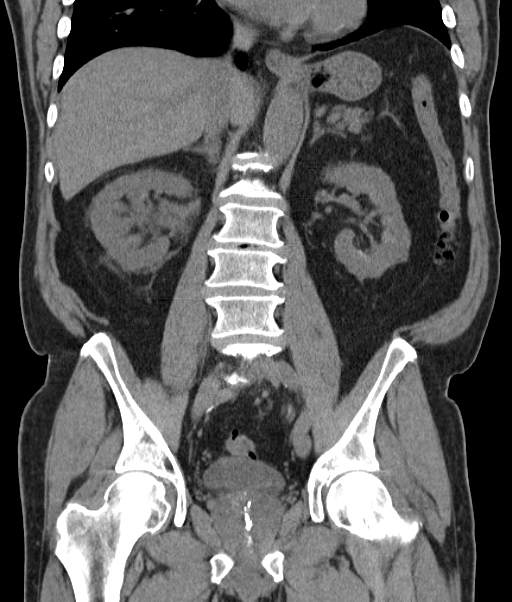

[16 of 46 positions shown; findings below may reference images not displayed]

FINDINGS: There is moderate hydronephrosis and hydroureter on the right
secondary to a calcified 7 x 4 mm diameter ureteropelvic junction
stone. There are additional stones in the renal collecting systems
measuring 1-2 mm in diameter. On the left there is no evidence of
obstruction. There are stable hypodensities in the left mid and
lower poles compatible with cysts. There are nonobstructing 1-2 mm
diameter stones in the left renal calyces. No ureteral stones are
demonstrated. The partially distended urinary bladder is normal in
appearance.

There are hypodensities in the liver which are stable compatible
with cysts. There is no intrahepatic ductal dilation. The
gallbladder is normal in appearance. The pancreas, spleen,
nondistended stomach, adrenal glands, and abdominal aorta exhibit no
acute abnormalities. The unopacified loops of small and large bowel
exhibit no evidence of ileus or obstruction or inflammation. A
normal calibered, partially gas-filled appendix is demonstrated.
There is sigmoid diverticulosis without evidence of acute
diverticulitis.

The prostate gland is mildly enlarged and contains multiple
calcifications. The seminal vesicles are normal in appearance. There
are bilateral inguinal hernias containing fat. There is no free
fluid in the pelvis. The psoas musculature is normal in appearance.

The lumbar spine exhibits degenerative disc change at multiple
levels. The bony pelvis exhibits no acute abnormality. The lung
bases exhibit minimal compressive atelectasis.
IMPRESSION: 1. There is moderate right-sided hydronephrosis secondary to a 7 mm
diameter ureteropelvic junction stone. There other nonobstructing
stones in both kidneys measuring up to 2 mm in diameter. There are
stable cysts in the left kidney.
2. There are cysts in the liver. No acute hepatobiliary abnormality
is demonstrated.
3. No acute bowel abnormality is demonstrated. There small bilateral
fat-containing inguinal hernias.
4. There are degenerative changes of the lumbar discs at multiple
levels.

## 2015-04-02 IMAGING — CR DG ABDOMEN 1V
1 series · 1 of 1 positions shown · non-contrast
Comparison: none

[view not recorded]
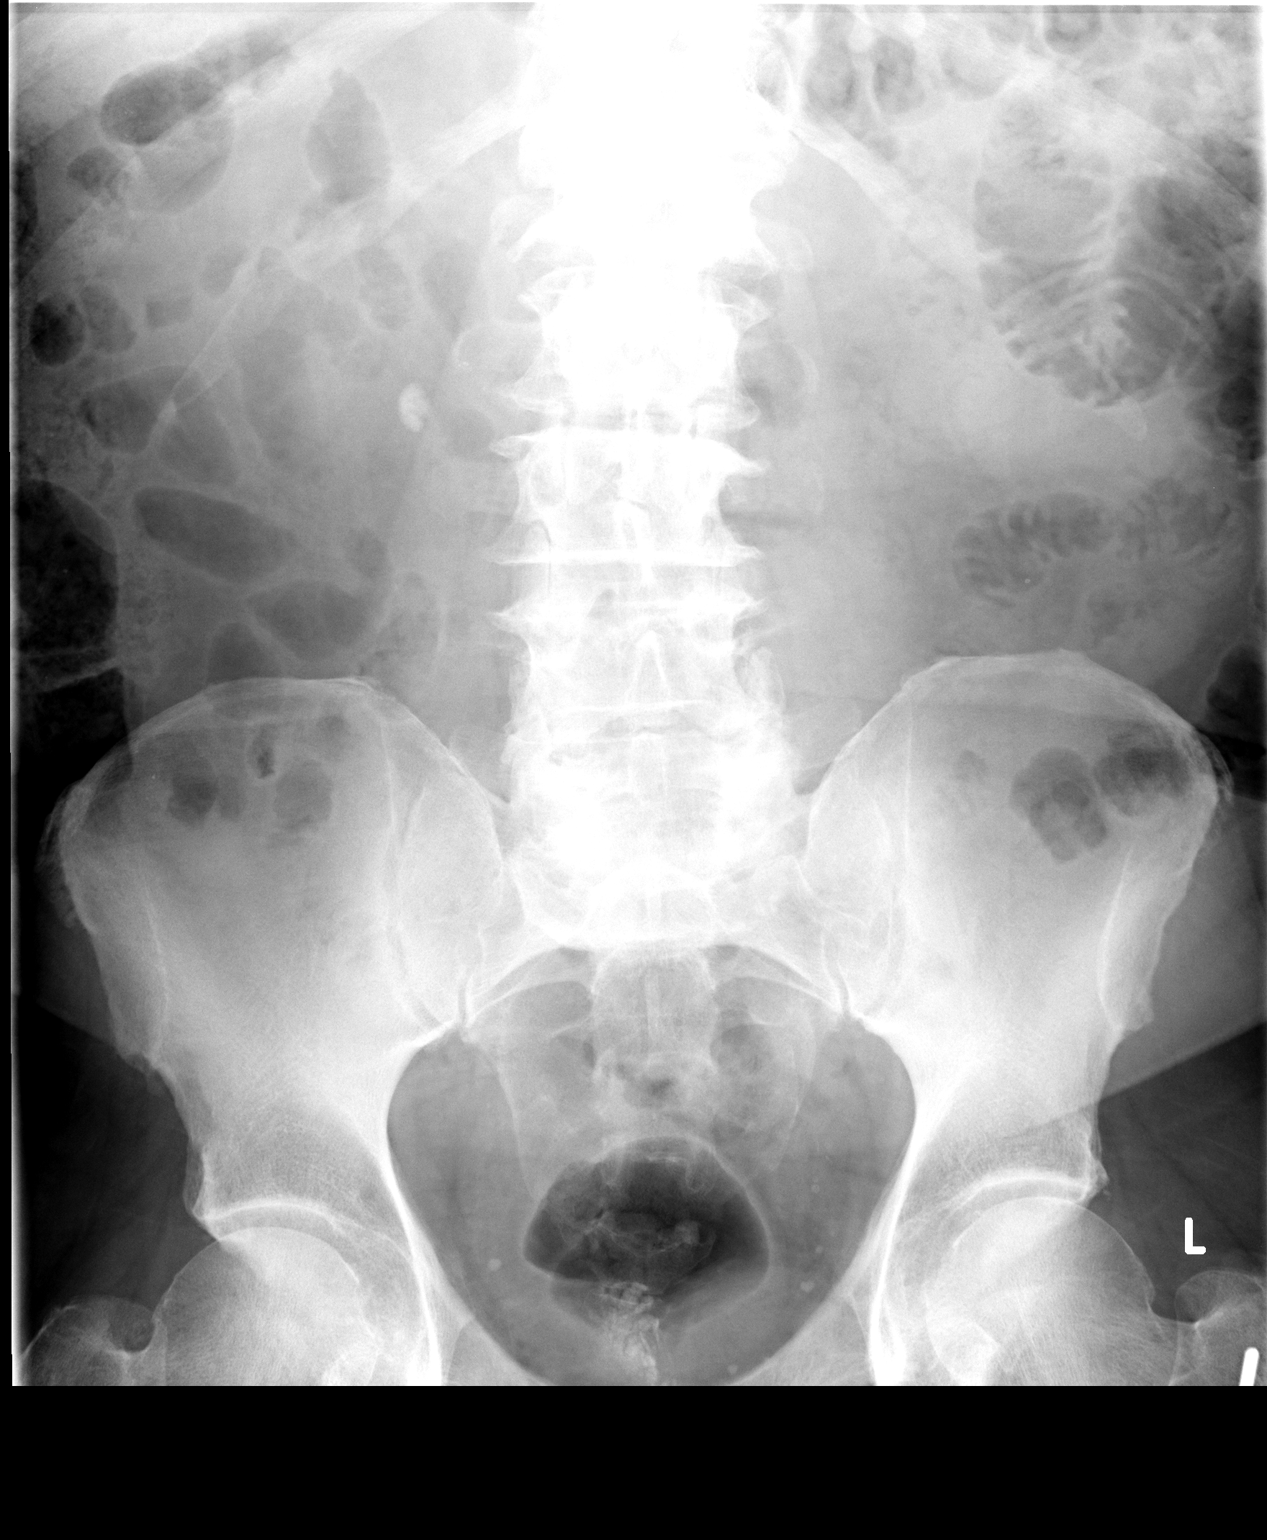

[1 of 1 positions shown; findings below may reference images not displayed]

CLINICAL DATA
Ureteral calculus

EXAM
ABDOMEN - 1 VIEW

COMPARISON
CT abdomen and pelvis February 22, 2014

FINDINGS
There is a focal calcification on the right at the level of L2-3
measuring 1.2 by 0.7 cm. There are phleboliths in the pelvis. There
are prostatic calculi. The bowel gas pattern is normal. No
obstruction or free air is seen on this supine examination. There is
degenerative type change in the lumbar spine.

IMPRESSION
Proximal right ureteral calculus. Multiple prostatic calculi. Bowel
gas pattern normal.

SIGNATURE

## 2015-05-22 ENCOUNTER — Other Ambulatory Visit: Payer: Self-pay | Admitting: *Deleted

## 2015-05-22 MED ORDER — METOPROLOL SUCCINATE ER 25 MG PO TB24: 25.0000 mg | ORAL_TABLET | Freq: Every day | ORAL | Status: DC

## 2015-06-13 DIAGNOSIS — Q6101 Congenital single renal cyst: Secondary | ICD-10-CM | POA: Diagnosis not present

## 2015-06-13 DIAGNOSIS — R1013 Epigastric pain: Secondary | ICD-10-CM | POA: Diagnosis not present

## 2015-06-13 DIAGNOSIS — K7689 Other specified diseases of liver: Secondary | ICD-10-CM | POA: Diagnosis not present

## 2015-06-13 DIAGNOSIS — N281 Cyst of kidney, acquired: Secondary | ICD-10-CM | POA: Diagnosis not present

## 2015-06-13 DIAGNOSIS — R1084 Generalized abdominal pain: Secondary | ICD-10-CM | POA: Diagnosis not present

## 2015-06-23 DIAGNOSIS — K219 Gastro-esophageal reflux disease without esophagitis: Secondary | ICD-10-CM | POA: Diagnosis not present

## 2015-06-23 DIAGNOSIS — E782 Mixed hyperlipidemia: Secondary | ICD-10-CM | POA: Diagnosis not present

## 2015-06-23 DIAGNOSIS — Q446 Cystic disease of liver: Secondary | ICD-10-CM | POA: Diagnosis not present

## 2015-06-23 DIAGNOSIS — I10 Essential (primary) hypertension: Secondary | ICD-10-CM | POA: Diagnosis not present

## 2015-06-23 DIAGNOSIS — I2510 Atherosclerotic heart disease of native coronary artery without angina pectoris: Secondary | ICD-10-CM | POA: Diagnosis not present

## 2015-08-17 ENCOUNTER — Other Ambulatory Visit: Payer: Self-pay | Admitting: *Deleted

## 2015-08-17 MED ORDER — CLOPIDOGREL BISULFATE 75 MG PO TABS: 75.0000 mg | ORAL_TABLET | Freq: Every day | ORAL | Status: DC

## 2015-08-30 DIAGNOSIS — Z23 Encounter for immunization: Secondary | ICD-10-CM | POA: Diagnosis not present

## 2015-08-30 DIAGNOSIS — S76012A Strain of muscle, fascia and tendon of left hip, initial encounter: Secondary | ICD-10-CM | POA: Diagnosis not present

## 2015-10-19 ENCOUNTER — Other Ambulatory Visit: Payer: Self-pay

## 2015-10-19 MED ORDER — CLOPIDOGREL BISULFATE 75 MG PO TABS: 75.0000 mg | ORAL_TABLET | Freq: Every day | ORAL | Status: DC

## 2015-11-18 ENCOUNTER — Other Ambulatory Visit: Payer: Self-pay | Admitting: Cardiology

## 2015-12-15 ENCOUNTER — Encounter: Payer: Self-pay | Admitting: Cardiology

## 2015-12-15 ENCOUNTER — Ambulatory Visit (INDEPENDENT_AMBULATORY_CARE_PROVIDER_SITE_OTHER): Payer: Medicare Other | Admitting: Cardiology

## 2015-12-15 VITALS — BP 167/85 | HR 55 | Ht 70.0 in | Wt 183.8 lb

## 2015-12-15 DIAGNOSIS — I2510 Atherosclerotic heart disease of native coronary artery without angina pectoris: Secondary | ICD-10-CM

## 2015-12-15 DIAGNOSIS — E782 Mixed hyperlipidemia: Secondary | ICD-10-CM | POA: Diagnosis not present

## 2015-12-15 NOTE — Patient Instructions (Signed)
Your physician recommends that you schedule a follow-up appointment in: [place]  Your physician recommends that you continue on your current medications as directed. Please refer to the Current Medication list given to you today.  Thank you for choosing [facility]!!

## 2015-12-15 NOTE — Progress Notes (Signed)
Cardiology Office Note  Date: 12/15/2015   ID: David Harmon, DOB [birth year], MRN [id number]  PCP: Gar Ponto, MD  Primary Cardiologist: Rozann Lesches, MD   Chief Complaint  Patient presents with  . Coronary Artery Disease    History of Present Illness: David Harmon is a 74 y.o. male last seen in October 2015. He presents for a routine follow-up visit. 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 He does not report any angina symptoms or limiting dyspnea on exertion. His next DOT physical will be in October 2017.  ECG today shows sinus bradycardia with right bundle branch block and left anterior fascicular block.  We discussed his medications which are outlined below. He has had no significant changes from a cardiac perspective. He continues on Lipitor and has follow-up with Dr. Quillian Quince on a regular basis.  Past Medical History  Diagnosis Date  . Coronary atherosclerosis of native coronary artery     DES RCA 11/09 (Dr. Terrence Dupont)  . Myocardial infarction ([place])     NSTEMI 11/09  . Mixed hyperlipidemia   . Essential hypertension, benign     Current Outpatient Prescriptions  Medication Sig Dispense Refill  . aspirin EC 81 MG tablet Take 81 mg by mouth daily.    Marland Kitchen atorvastatin (LIPITOR) 40 MG tablet Take 40 mg by mouth daily.      . clopidogrel (PLAVIX) 75 MG tablet take 1 tablet by mouth once daily 30 tablet 3  . lisinopril-hydrochlorothiazide (PRINZIDE,ZESTORETIC) 20-12.5 MG per tablet Take 1 tablet by mouth daily.     . metoprolol succinate (TOPROL-XL) 25 MG 24 hr tablet Take 1 tablet (25 mg total) by mouth daily. 90 tablet 3  . Multiple Vitamins-Minerals (DAILY MENS HEALTH FORMULA PO) Take 1 tablet by mouth daily.     . nitroGLYCERIN (NITROSTAT) 0.4 MG SL tablet Place 1 tablet (0.4 mg total) under the tongue every 5 (five) minutes as needed for chest pain. 25 tablet 6   No current facility-administered  medications for this visit.   Allergies:  Review of patient's allergies indicates no known allergies.   Social History: The patient  reports that he quit smoking about 50 years ago. His smoking use included Cigarettes. He has a 5 pack-year smoking history. He has never used smokeless tobacco. He reports that he does not drink alcohol or use illicit drugs.   ROS:  Please see the history of present illness. Otherwise, complete review of systems is positive for none.  All other systems are reviewed and negative.   Physical Exam: VS:  BP 167/85 mmHg  Pulse 55  Ht 5\' 10"  (1.778 m)  Wt 183 lb 12.8 oz (83.371 kg)  BMI 26.37 kg/m2  SpO2 97%, BMI Body mass index is 26.37 kg/(m^2).  Wt Readings from Last 3 Encounters:  12/15/15 183 lb 12.8 oz (83.371 kg)  09/19/14 186 lb (84.369 kg)  05/17/14 185 lb 1.9 oz (83.97 kg)    Appears comfortable at rest.  HEENT: Conjunctiva and lids normal, oropharynx with moist mucosa.  Neck: Supple, no elevated JVP or carotid bruits, no thyromegaly.  Lungs: Clear to auscultation, nonlabored.  Cardiac: Regular rate and rhythm, no S3 gallop or pericardial rub, no significant murmur.  Abdomen: Soft, nontender, no bruits, bowel sounds present.  Skin: Warm and dry, no ulcerations.  Extremities: No pitting edema, distal pulses 1-2+.   ECG: ECG is ordered today.  Other Studies Reviewed  Today:  Lexiscan Cardiolite 09/20/2014: FINDINGS: Stress/ECG data: The patient was stressed according to the Lexiscan protocol. The heart rate ranged from 54 to 81 beats per min. The blood pressure remained in the 140/80 mmHg range. No chest pain was reported.  Baseline tracings demonstrated sinus bradycardia, heart rate 54 beats per min, with an underlying right bundle-branch block. There were no ischemic abnormalities nor any arrhythmias with stress.  Perfusion: No decreased activity in the left ventricle on stress imaging to suggest reversible ischemia or  infarction. Inferior wall soft tissue attenuation artifact noted.  Wall Motion: Normal left ventricular wall motion. No left ventricular dilation.  Left Ventricular Ejection Fraction: 63 %  End diastolic volume 82 ml  End systolic volume 30 ml  IMPRESSION: 1. No reversible ischemia or infarction.  2. Normal left ventricular wall motion.  3. Left ventricular ejection fraction 63%  4. Low-risk stress test findings*.  Assessment and Plan:  1. Symptomatically stable CAD status post DES to the RCA in 2009. Stress testing from last October was low risk as outlined above. We will continue medical therapy and observation. Next visit for September in anticipation of a follow-up stress test per DOT evaluation.  2. Hyperlipidemia, he continues on statin therapy and has follow-up with Dr. Quillian Quince.  Current medicines were reviewed with the patient today.   Orders Placed This Encounter  Procedures  . EKG 12-Lead    Disposition: FU with me in 9 months.   Signed, Satira Sark, MD, [facility] 12/15/2015 2:38 PM    [place] [facility] [place], [place], [place] [address] Phone: [phone]; Fax: [phone]

## 2016-02-13 DIAGNOSIS — M7552 Bursitis of left shoulder: Secondary | ICD-10-CM | POA: Diagnosis not present

## 2016-03-15 ENCOUNTER — Other Ambulatory Visit: Payer: Self-pay | Admitting: Adult Health

## 2016-05-15 ENCOUNTER — Other Ambulatory Visit: Payer: Self-pay | Admitting: *Deleted

## 2016-05-15 MED ORDER — METOPROLOL SUCCINATE ER 25 MG PO TB24: 25.0000 mg | ORAL_TABLET | Freq: Every day | ORAL | Status: DC

## 2016-05-28 DIAGNOSIS — E782 Mixed hyperlipidemia: Secondary | ICD-10-CM | POA: Diagnosis not present

## 2016-05-28 DIAGNOSIS — I10 Essential (primary) hypertension: Secondary | ICD-10-CM | POA: Diagnosis not present

## 2016-05-28 DIAGNOSIS — Q446 Cystic disease of liver: Secondary | ICD-10-CM | POA: Diagnosis not present

## 2016-05-28 DIAGNOSIS — I240 Acute coronary thrombosis not resulting in myocardial infarction: Secondary | ICD-10-CM | POA: Diagnosis not present

## 2016-05-28 DIAGNOSIS — K219 Gastro-esophageal reflux disease without esophagitis: Secondary | ICD-10-CM | POA: Diagnosis not present

## 2016-06-04 DIAGNOSIS — I2510 Atherosclerotic heart disease of native coronary artery without angina pectoris: Secondary | ICD-10-CM | POA: Diagnosis not present

## 2016-06-04 DIAGNOSIS — M7551 Bursitis of right shoulder: Secondary | ICD-10-CM | POA: Diagnosis not present

## 2016-06-04 DIAGNOSIS — E782 Mixed hyperlipidemia: Secondary | ICD-10-CM | POA: Diagnosis not present

## 2016-06-04 DIAGNOSIS — I10 Essential (primary) hypertension: Secondary | ICD-10-CM | POA: Diagnosis not present

## 2016-06-04 DIAGNOSIS — Z1212 Encounter for screening for malignant neoplasm of rectum: Secondary | ICD-10-CM | POA: Diagnosis not present

## 2016-06-04 DIAGNOSIS — K219 Gastro-esophageal reflux disease without esophagitis: Secondary | ICD-10-CM | POA: Diagnosis not present

## 2016-06-04 DIAGNOSIS — Z0001 Encounter for general adult medical examination with abnormal findings: Secondary | ICD-10-CM | POA: Diagnosis not present

## 2016-07-14 ENCOUNTER — Other Ambulatory Visit: Payer: Self-pay | Admitting: Cardiology

## 2016-08-16 ENCOUNTER — Encounter: Payer: Self-pay | Admitting: Cardiology

## 2016-08-16 ENCOUNTER — Encounter: Payer: Self-pay | Admitting: *Deleted

## 2016-08-16 ENCOUNTER — Ambulatory Visit (INDEPENDENT_AMBULATORY_CARE_PROVIDER_SITE_OTHER): Payer: Medicare Other | Admitting: Cardiology

## 2016-08-16 VITALS — BP 138/80 | HR 67 | Ht 70.0 in | Wt 186.0 lb

## 2016-08-16 DIAGNOSIS — E782 Mixed hyperlipidemia: Secondary | ICD-10-CM

## 2016-08-16 DIAGNOSIS — I2510 Atherosclerotic heart disease of native coronary artery without angina pectoris: Secondary | ICD-10-CM | POA: Diagnosis not present

## 2016-08-16 DIAGNOSIS — I10 Essential (primary) hypertension: Secondary | ICD-10-CM | POA: Diagnosis not present

## 2016-08-16 NOTE — Progress Notes (Signed)
Cardiology Office Note  Date: 08/16/2016   ID: David Harmon, DOB [birth year], MRN [id number]  PCP: Gar Ponto, MD  Primary Cardiologist: Rozann Lesches, MD   Chief Complaint  Patient presents with  . Coronary Artery Disease    History of Present Illness: David Harmon is a 75 y.o. male last seen in December 2016. He presents for a routine follow-up visit. He does not report any angina symptoms or nitroglycerin use, doing well overall. 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 He is due for a follow-up stress test as part of DOT evaluation.  I reviewed his medications which are outlined below. There has been no changes from a cardiac perspective. He follows lipids with Dr. Quillian Quince.  Past Medical History:  Diagnosis Date  . Coronary atherosclerosis of native coronary artery    DES RCA 11/09 (Dr. Terrence Dupont)  . Essential hypertension, benign   . Mixed hyperlipidemia   . Myocardial infarction [facility])    NSTEMI 11/09    Current Outpatient Prescriptions  Medication Sig Dispense Refill  . aspirin EC 81 MG tablet Take 81 mg by mouth daily.    Marland Kitchen atorvastatin (LIPITOR) 40 MG tablet Take 40 mg by mouth daily.      . clopidogrel (PLAVIX) 75 MG tablet take 1 tablet by mouth once daily 30 tablet 6  . lisinopril-hydrochlorothiazide (PRINZIDE,ZESTORETIC) 20-12.5 MG per tablet Take 1 tablet by mouth daily.     . metoprolol succinate (TOPROL-XL) 25 MG 24 hr tablet Take 1 tablet (25 mg total) by mouth daily. 90 tablet 3  . Multiple Vitamins-Minerals (DAILY MENS HEALTH FORMULA PO) Take 1 tablet by mouth daily.     . nitroGLYCERIN (NITROSTAT) 0.4 MG SL tablet Place 1 tablet (0.4 mg total) under the tongue every 5 (five) minutes as needed for chest pain. 25 tablet 6   No current facility-administered medications for this visit.    Allergies:  Review of patient's allergies indicates no known allergies.   Social  History: The patient  reports that he quit smoking about 50 years ago. His smoking use included Cigarettes. He has a 5.00 pack-year smoking history. He has never used smokeless tobacco. He reports that he does not drink alcohol or use drugs.   ROS:  Please see the history of present illness. Otherwise, complete review of systems is positive for musculoskeletal shoulder pains.  All other systems are reviewed and negative.   Physical Exam: VS:  BP 138/80   Pulse 67   Ht 5\' 10"  (1.778 m)   Wt 186 lb (84.4 kg)   SpO2 96%   BMI 26.69 kg/m , BMI Body mass index is 26.69 kg/m.  Wt Readings from Last 3 Encounters:  08/16/16 186 lb (84.4 kg)  12/15/15 183 lb 12.8 oz (83.4 kg)  09/19/14 186 lb (84.4 kg)    Appears comfortable at rest.  HEENT: Conjunctiva and lids normal, oropharynx with moist mucosa.  Neck: Supple, no elevated JVP or carotid bruits, no thyromegaly.  Lungs: Clear to auscultation, nonlabored.  Cardiac: Regular rate and rhythm, no S3 gallop or pericardial rub, no significant murmur.  Abdomen: Soft, nontender, no bruits, bowel sounds present.  Skin: Warm and dry, no ulcerations.  Extremities: No pitting edema, distal pulses 1-2+.  ECG: I personally reviewed the tracing from 12/15/2015 which showed sinus bradycardia with right bundle branch block and left anterior fascicular block.  Other Studies Reviewed  Today:  Lexiscan Cardiolite 09/20/2014: FINDINGS: Stress/ECG data: The patient was stressed according to the Lexiscan protocol. The heart rate ranged from 54 to 81 beats per min. The blood pressure remained in the 140/80 mmHg range. No chest pain was reported.  Baseline tracings demonstrated sinus bradycardia, heart rate 54 beats per min, with an underlying right bundle-branch block. There were no ischemic abnormalities nor any arrhythmias with stress.  Perfusion: No decreased activity in the left ventricle on stress imaging to suggest reversible ischemia or  infarction. Inferior wall soft tissue attenuation artifact noted.  Wall Motion: Normal left ventricular wall motion. No left ventricular dilation.  Left Ventricular Ejection Fraction: 63 %  End diastolic volume 82 ml  End systolic volume 30 ml  IMPRESSION: 1. No reversible ischemia or infarction.  2. Normal left ventricular wall motion.  3. Left ventricular ejection fraction 63%  4. Low-risk stress test findings*.  Assessment and Plan:  1. CAD status post DES to the RCA in 2009. No active angina symptoms on medical therapy. As part of his routine DOT evaluation he is due for follow-up stress testing. We will arrange a Lexiscan Myoview on medical therapy. If low risk, anticipate follow-up in one year.  2. Hyperlipidemia, on Lipitor. Keep follow-up with Dr. Quillian Quince.  3. Essential hypertension, blood pressure is adequately controlled today.  Current medicines were reviewed with the patient today.   Orders Placed This Encounter  Procedures  . NM Myocar Multi W/Spect W/Wall Motion / EF  . Myocardial Perfusion Imaging    Disposition: Follow-up with me in one year.  Signed, Satira Sark, MD, [facility] 08/16/2016 4:39 PM    [place] at [place], [place], [place] [address] Phone: [phone]; Fax: [phone]

## 2016-08-16 NOTE — Patient Instructions (Addendum)
Medication Instructions:  Continue all current medications.  Labwork: none  Testing/Procedures:  Your physician has requested that you have a lexiscan myoview. For further information please visit [email or web address]. Please follow instruction sheet, as given.  Office will contact with results via phone or letter.    Follow-Up: Your physician wants you to follow up in:  1 year.  You will receive a reminder letter in the mail one-two months in advance.  If you don't receive a letter, please call our office to schedule the follow up appointment   Any Other Special Instructions Will Be Listed Below (If Applicable).  If you need a refill on your cardiac medications before your next appointment, please call your pharmacy.  

## 2016-08-29 ENCOUNTER — Encounter (HOSPITAL_COMMUNITY)
Admission: RE | Admit: 2016-08-29 | Discharge: 2016-08-29 | Disposition: A | Discharge status: Home / Self Care | Payer: Medicare Other | Source: Ambulatory Visit | Attending: Cardiology | Admitting: Cardiology

## 2016-08-29 ENCOUNTER — Encounter (HOSPITAL_COMMUNITY): Admission: RE | Admit: 2016-08-29 | Payer: Medicare Other | Source: Ambulatory Visit

## 2016-08-29 ENCOUNTER — Encounter (HOSPITAL_COMMUNITY): Payer: Self-pay

## 2016-08-29 DIAGNOSIS — I2510 Atherosclerotic heart disease of native coronary artery without angina pectoris: Secondary | ICD-10-CM | POA: Diagnosis not present

## 2016-08-29 LAB — NM MYOCAR MULTI W/SPECT W/WALL MOTION / EF
CHL CUP NUCLEAR SRS: 5
LV sys vol: 32 mL
LVDIAVOL: 76 mL (ref 62–150)
NUC STRESS TID: 1.15
Peak HR: 80 {beats}/min
RATE: 0.25
Rest HR: 57 {beats}/min
SDS: 0
SSS: 5

## 2016-08-29 MED ORDER — REGADENOSON 0.4 MG/5ML IV SOLN
INTRAVENOUS | Status: AC
  Administered 2016-08-29: 0.4 mg via INTRAVENOUS
  Filled 2016-08-29: qty 5

## 2016-08-29 MED ORDER — TECHNETIUM TC 99M TETROFOSMIN IV KIT
10.0000 | PACK | Freq: Once | INTRAVENOUS | Status: AC | PRN
  Administered 2016-08-29: 10 via INTRAVENOUS

## 2016-08-29 MED ORDER — SODIUM CHLORIDE 0.9% FLUSH
INTRAVENOUS | Status: AC
  Administered 2016-08-29: 10 mL via INTRAVENOUS
  Filled 2016-08-29: qty 10

## 2016-08-29 MED ORDER — TECHNETIUM TC 99M TETROFOSMIN IV KIT
30.0000 | PACK | Freq: Once | INTRAVENOUS | Status: AC | PRN
  Administered 2016-08-29: 30 via INTRAVENOUS

## 2016-09-02 ENCOUNTER — Telehealth: Payer: Self-pay | Admitting: *Deleted

## 2016-09-02 NOTE — Telephone Encounter (Signed)
Notes Recorded by Laurine Blazer, LPN on 624THL at D34-534 PM EDT Patient notified and verbalized understanding. Copy to Dr. Salem Caster (Rosser) & pmd. ------  Notes Recorded by Laurine Blazer, LPN on 624THL at [id number] AM EDT Left message to return call.  ------  Notes Recorded by Satira Sark, MD on 08/30/2016 at 10:24 AM EDT Results reviewed. Low risk study showing inferior scar from old infarct and normal LVEF - this is consistent with his history. Please let him know and forward to provider involved in his DOT evaluation. Keep one year follow-up. A copy of this test should be forwarded to Gar Ponto, MD.

## 2016-09-05 ENCOUNTER — Telehealth: Payer: Self-pay | Admitting: Cardiology

## 2016-09-05 NOTE — Telephone Encounter (Signed)
Stress test was sent to occupational health for this patient already.  Will check with MD to see if okay to give letter stating clear to drive from cardiac perspective?

## 2016-09-05 NOTE — Telephone Encounter (Signed)
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

## 2016-09-06 ENCOUNTER — Encounter: Payer: Self-pay | Admitting: *Deleted

## 2016-09-06 NOTE — Telephone Encounter (Signed)
Pt aware that letter will be available here at office - he will come Monday to pick it up

## 2016-09-06 NOTE — Telephone Encounter (Signed)
Stress test was consistent with his known cardiac disease and was low risk overall. He has been clinically stable as per recent office note. Based on this information, he should be able to continue with his present job description. 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 I do not see a reason to restrict his current driving.

## 2016-09-19 DIAGNOSIS — L570 Actinic keratosis: Secondary | ICD-10-CM | POA: Diagnosis not present

## 2016-09-19 DIAGNOSIS — Z23 Encounter for immunization: Secondary | ICD-10-CM | POA: Diagnosis not present

## 2016-09-19 DIAGNOSIS — M7551 Bursitis of right shoulder: Secondary | ICD-10-CM | POA: Diagnosis not present

## 2016-10-19 DIAGNOSIS — I10 Essential (primary) hypertension: Secondary | ICD-10-CM | POA: Diagnosis not present

## 2016-10-19 DIAGNOSIS — M7551 Bursitis of right shoulder: Secondary | ICD-10-CM | POA: Diagnosis not present

## 2016-11-28 DIAGNOSIS — K219 Gastro-esophageal reflux disease without esophagitis: Secondary | ICD-10-CM | POA: Diagnosis not present

## 2016-11-28 DIAGNOSIS — I10 Essential (primary) hypertension: Secondary | ICD-10-CM | POA: Diagnosis not present

## 2016-11-28 DIAGNOSIS — E782 Mixed hyperlipidemia: Secondary | ICD-10-CM | POA: Diagnosis not present

## 2016-12-03 DIAGNOSIS — I10 Essential (primary) hypertension: Secondary | ICD-10-CM | POA: Diagnosis not present

## 2016-12-03 DIAGNOSIS — K219 Gastro-esophageal reflux disease without esophagitis: Secondary | ICD-10-CM | POA: Diagnosis not present

## 2016-12-03 DIAGNOSIS — E782 Mixed hyperlipidemia: Secondary | ICD-10-CM | POA: Diagnosis not present

## 2016-12-03 DIAGNOSIS — I2510 Atherosclerotic heart disease of native coronary artery without angina pectoris: Secondary | ICD-10-CM | POA: Diagnosis not present

## 2016-12-03 DIAGNOSIS — Q446 Cystic disease of liver: Secondary | ICD-10-CM | POA: Diagnosis not present

## 2017-02-19 ENCOUNTER — Other Ambulatory Visit: Payer: Self-pay | Admitting: Cardiology

## 2017-03-18 DIAGNOSIS — Z23 Encounter for immunization: Secondary | ICD-10-CM | POA: Diagnosis not present

## 2017-03-18 DIAGNOSIS — M7551 Bursitis of right shoulder: Secondary | ICD-10-CM | POA: Diagnosis not present

## 2017-05-01 DIAGNOSIS — H40053 Ocular hypertension, bilateral: Secondary | ICD-10-CM | POA: Diagnosis not present

## 2017-05-21 ENCOUNTER — Other Ambulatory Visit: Payer: Self-pay | Admitting: Cardiology

## 2017-06-19 DIAGNOSIS — R5383 Other fatigue: Secondary | ICD-10-CM | POA: Diagnosis not present

## 2017-06-19 DIAGNOSIS — I10 Essential (primary) hypertension: Secondary | ICD-10-CM | POA: Diagnosis not present

## 2017-06-19 DIAGNOSIS — E782 Mixed hyperlipidemia: Secondary | ICD-10-CM | POA: Diagnosis not present

## 2017-06-26 DIAGNOSIS — Z0001 Encounter for general adult medical examination with abnormal findings: Secondary | ICD-10-CM | POA: Diagnosis not present

## 2017-06-26 DIAGNOSIS — I2510 Atherosclerotic heart disease of native coronary artery without angina pectoris: Secondary | ICD-10-CM | POA: Diagnosis not present

## 2017-06-26 DIAGNOSIS — Z23 Encounter for immunization: Secondary | ICD-10-CM | POA: Diagnosis not present

## 2017-06-26 DIAGNOSIS — Z1212 Encounter for screening for malignant neoplasm of rectum: Secondary | ICD-10-CM | POA: Diagnosis not present

## 2017-06-26 DIAGNOSIS — I10 Essential (primary) hypertension: Secondary | ICD-10-CM | POA: Diagnosis not present

## 2017-06-26 DIAGNOSIS — E782 Mixed hyperlipidemia: Secondary | ICD-10-CM | POA: Diagnosis not present

## 2017-06-26 DIAGNOSIS — K219 Gastro-esophageal reflux disease without esophagitis: Secondary | ICD-10-CM | POA: Diagnosis not present

## 2017-07-14 DIAGNOSIS — H35363 Drusen (degenerative) of macula, bilateral: Secondary | ICD-10-CM | POA: Diagnosis not present

## 2017-07-14 DIAGNOSIS — H2512 Age-related nuclear cataract, left eye: Secondary | ICD-10-CM | POA: Diagnosis not present

## 2017-07-14 DIAGNOSIS — H25811 Combined forms of age-related cataract, right eye: Secondary | ICD-10-CM | POA: Diagnosis not present

## 2017-07-18 NOTE — Patient Instructions (Signed)
Your procedure is scheduled on:  07/28/2017   Report to [facility] at  40  AM.  Call this number if you have problems the morning of surgery: [phone]   Do not eat food or drink liquids :After Midnight.      Take these medicines the morning of surgery with A SIP OF WATER: lisinopril, metoprolol.   Do not wear jewelry, make-up or nail polish.  Do not wear lotions, powders, or perfumes. You may wear deodorant.  Do not shave 48 hours prior to surgery.  Do not bring valuables to the hospital.  Contacts, dentures or bridgework may not be worn into surgery.  Leave suitcase in the car. After surgery it may be brought to your room.  For patients admitted to the hospital, checkout time is 11:00 AM the day of discharge.   Patients discharged the day of surgery will not be allowed to drive home.  :     Please read over the following fact sheets that you were given: Coughing and Deep Breathing, Surgical Site Infection Prevention, Anesthesia Post-op Instructions and Care and Recovery After Surgery    Cataract A cataract is a clouding of the lens of the eye. When a lens becomes cloudy, vision is reduced based on the degree and nature of the clouding. Many cataracts reduce vision to some degree. Some cataracts make people more near-sighted as they develop. Other cataracts increase glare. Cataracts that are ignored and become worse can sometimes look white. The white color can be seen through the pupil. CAUSES   Aging. However, cataracts may occur at any age, even in newborns.   Certain drugs.   Trauma to the eye.   Certain diseases such as diabetes.   Specific eye diseases such as chronic inflammation inside the eye or a sudden attack of a rare form of glaucoma.   Inherited or acquired medical problems.  SYMPTOMS   Gradual, progressive drop in vision in the affected eye.   Severe, rapid visual loss. This most often happens when trauma is the cause.  DIAGNOSIS  To detect a cataract, an  eye doctor examines the lens. Cataracts are best diagnosed with an exam of the eyes with the pupils enlarged (dilated) by drops.  TREATMENT  For an early cataract, vision may improve by using different eyeglasses or stronger lighting. If that does not help your vision, surgery is the only effective treatment. A cataract needs to be surgically removed when vision loss interferes with your everyday activities, such as driving, reading, or watching TV. A cataract may also have to be removed if it prevents examination or treatment of another eye problem. Surgery removes the cloudy lens and usually replaces it with a substitute lens (intraocular lens, IOL).  At a time when both you and your doctor agree, the cataract will be surgically removed. If you have cataracts in both eyes, only one is usually removed at a time. This allows the operated eye to heal and be out of danger from any possible problems after surgery (such as infection or poor wound healing). In rare cases, a cataract may be doing damage to your eye. In these cases, your caregiver may advise surgical removal right away. The vast majority of people who have cataract surgery have better vision afterward. HOME CARE INSTRUCTIONS  If you are not planning surgery, you may be asked to do the following:  Use different eyeglasses.   Use stronger or brighter lighting.   Ask your eye doctor about reducing your  medicine dose or changing medicines if it is thought that a medicine caused your cataract. Changing medicines does not make the cataract go away on its own.   Become familiar with your surroundings. Poor vision can lead to injury. Avoid bumping into things on the affected side. You are at a higher risk for tripping or falling.   Exercise extreme care when driving or operating machinery.   Wear sunglasses if you are sensitive to bright light or experiencing problems with glare.  SEEK IMMEDIATE MEDICAL CARE IF:   You have a worsening or sudden  vision loss.   You notice redness, swelling, or increasing pain in the eye.   You have a fever.  Document Released: 12/02/2005 Document Revised: 11/21/2011 Document Reviewed: 07/26/2011 [facility] Patient Information 2012 [place].PATIENT INSTRUCTIONS POST-ANESTHESIA  IMMEDIATELY FOLLOWING SURGERY:  Do not drive or operate machinery for the first twenty four hours after surgery.  Do not make any important decisions for twenty four hours after surgery or while taking narcotic pain medications or sedatives.  If you develop intractable nausea and vomiting or a severe headache please notify your doctor immediately.  FOLLOW-UP:  Please make an appointment with your surgeon as instructed. You do not need to follow up with anesthesia unless specifically instructed to do so.  WOUND CARE INSTRUCTIONS (if applicable):  Keep a dry clean dressing on the anesthesia/puncture wound site if there is drainage.  Once the wound has quit draining you may leave it open to air.  Generally you should leave the bandage intact for twenty four hours unless there is drainage.  If the epidural site drains for more than 36-48 hours please call the anesthesia department.  QUESTIONS?:  Please feel free to call your physician or the hospital operator if you have any questions, and they will be happy to assist you.

## 2017-07-23 ENCOUNTER — Encounter (HOSPITAL_COMMUNITY)
Admission: RE | Admit: 2017-07-23 | Discharge: 2017-07-23 | Disposition: A | Discharge status: Home / Self Care | Payer: Medicare Other | Source: Ambulatory Visit | Attending: Ophthalmology | Admitting: Ophthalmology

## 2017-07-23 ENCOUNTER — Encounter (HOSPITAL_COMMUNITY): Payer: Self-pay

## 2017-07-23 ENCOUNTER — Other Ambulatory Visit: Payer: Self-pay

## 2017-07-23 DIAGNOSIS — Z01812 Encounter for preprocedural laboratory examination: Secondary | ICD-10-CM | POA: Diagnosis not present

## 2017-07-23 DIAGNOSIS — H25811 Combined forms of age-related cataract, right eye: Secondary | ICD-10-CM | POA: Diagnosis not present

## 2017-07-23 DIAGNOSIS — Z01810 Encounter for preprocedural cardiovascular examination: Secondary | ICD-10-CM | POA: Diagnosis not present

## 2017-07-23 HISTORY — DX: Unspecified osteoarthritis, unspecified site: M19.90

## 2017-07-23 HISTORY — DX: Personal history of urinary calculi: Z87.442

## 2017-07-23 LAB — BASIC METABOLIC PANEL
ANION GAP: 9 (ref 5–15)
BUN: 20 mg/dL (ref 6–20)
CALCIUM: 9.5 mg/dL (ref 8.9–10.3)
CO2: 27 mmol/L (ref 22–32)
CREATININE: 0.83 mg/dL (ref 0.61–1.24)
Chloride: 104 mmol/L (ref 101–111)
GFR calc non Af Amer: >60 mL/min (ref >60)
Glucose, Bld: 100 mg/dL — ABNORMAL HIGH (ref 65–99)
POTASSIUM: 4.3 mmol/L (ref 3.5–5.1)
Sodium: 140 mmol/L (ref 135–145)

## 2017-07-23 LAB — CBC
HCT: 44.2 % (ref 39.0–52.0)
Hemoglobin: 14.4 g/dL (ref 13.0–17.0)
MCH: 30 pg (ref 26.0–34.0)
MCHC: 32.6 g/dL (ref 30.0–36.0)
MCV: 92.1 fL (ref 78.0–100.0)
PLATELETS: 232 10*3/uL (ref 150–400)
RBC: 4.8 MIL/uL (ref 4.22–5.81)
RDW: 13.1 % (ref 11.5–15.5)
WBC: 5.7 10*3/uL (ref 4.0–10.5)

## 2017-07-28 ENCOUNTER — Ambulatory Visit (HOSPITAL_COMMUNITY): Payer: Medicare Other | Admitting: Anesthesiology

## 2017-07-28 ENCOUNTER — Encounter (HOSPITAL_COMMUNITY): Payer: Self-pay

## 2017-07-28 ENCOUNTER — Encounter (HOSPITAL_COMMUNITY)
Admission: RE | Disposition: A | Discharge status: Home / Self Care | Payer: Self-pay | Source: Ambulatory Visit | Attending: Ophthalmology

## 2017-07-28 ENCOUNTER — Ambulatory Visit (HOSPITAL_COMMUNITY)
Admission: RE | Admit: 2017-07-28 | Discharge: 2017-07-28 | Disposition: A | Discharge status: Home / Self Care | Payer: Medicare Other | Source: Ambulatory Visit | Attending: Ophthalmology | Admitting: Ophthalmology

## 2017-07-28 DIAGNOSIS — H25811 Combined forms of age-related cataract, right eye: Secondary | ICD-10-CM | POA: Diagnosis not present

## 2017-07-28 DIAGNOSIS — Z87891 Personal history of nicotine dependence: Secondary | ICD-10-CM | POA: Insufficient documentation

## 2017-07-28 DIAGNOSIS — I10 Essential (primary) hypertension: Secondary | ICD-10-CM | POA: Diagnosis not present

## 2017-07-28 DIAGNOSIS — H2511 Age-related nuclear cataract, right eye: Secondary | ICD-10-CM | POA: Diagnosis not present

## 2017-07-28 DIAGNOSIS — H269 Unspecified cataract: Secondary | ICD-10-CM | POA: Diagnosis present

## 2017-07-28 DIAGNOSIS — M1990 Unspecified osteoarthritis, unspecified site: Secondary | ICD-10-CM | POA: Insufficient documentation

## 2017-07-28 HISTORY — PX: CATARACT EXTRACTION W/PHACO: SHX586

## 2017-07-28 SURGERY — PHACOEMULSIFICATION, CATARACT, WITH IOL INSERTION
Anesthesia: Monitor Anesthesia Care | Site: Eye | Laterality: Right

## 2017-07-28 MED ORDER — PHENYLEPHRINE HCL 2.5 % OP SOLN
1.0000 [drp] | OPHTHALMIC | Status: AC
  Administered 2017-07-28 (×3): 1 [drp] via OPHTHALMIC

## 2017-07-28 MED ORDER — NEOMYCIN-POLYMYXIN-DEXAMETH 3.5-10000-0.1 OP SUSP
OPHTHALMIC | Status: DC | PRN
  Administered 2017-07-28: 2 [drp] via OPHTHALMIC

## 2017-07-28 MED ORDER — CYCLOPENTOLATE-PHENYLEPHRINE 0.2-1 % OP SOLN
1.0000 [drp] | OPHTHALMIC | Status: AC
  Administered 2017-07-28 (×3): 1 [drp] via OPHTHALMIC

## 2017-07-28 MED ORDER — BSS IO SOLN
INTRAOCULAR | Status: DC | PRN
  Administered 2017-07-28: 15 mL

## 2017-07-28 MED ORDER — FENTANYL CITRATE (PF) 100 MCG/2ML IJ SOLN
25.0000 ug | Freq: Once | INTRAMUSCULAR | Status: AC
  Administered 2017-07-28: 25 ug via INTRAVENOUS

## 2017-07-28 MED ORDER — TETRACAINE HCL 0.5 % OP SOLN
1.0000 [drp] | OPHTHALMIC | Status: AC
  Administered 2017-07-28 (×3): 1 [drp] via OPHTHALMIC

## 2017-07-28 MED ORDER — LIDOCAINE HCL (PF) 1 % IJ SOLN
INTRAMUSCULAR | Status: DC | PRN
  Administered 2017-07-28: .5 mL

## 2017-07-28 MED ORDER — BSS IO SOLN
INTRAOCULAR | Status: DC | PRN
  Administered 2017-07-28: 500 mL

## 2017-07-28 MED ORDER — MIDAZOLAM HCL 2 MG/2ML IJ SOLN
1.0000 mg | INTRAMUSCULAR | Status: AC
  Administered 2017-07-28: 2 mg via INTRAVENOUS

## 2017-07-28 MED ORDER — LACTATED RINGERS IV SOLN
INTRAVENOUS | Status: DC
  Administered 2017-07-28: 12:00:00 via INTRAVENOUS

## 2017-07-28 MED ORDER — MIDAZOLAM HCL 2 MG/2ML IJ SOLN
INTRAMUSCULAR | Status: AC
  Filled 2017-07-28: qty 2

## 2017-07-28 MED ORDER — LIDOCAINE HCL 3.5 % OP GEL: 1.0000 "application " | Freq: Once | OPHTHALMIC | Status: DC

## 2017-07-28 MED ORDER — POVIDONE-IODINE 5 % OP SOLN
OPHTHALMIC | Status: DC | PRN
  Administered 2017-07-28: 1 via OPHTHALMIC

## 2017-07-28 MED ORDER — PROVISC 10 MG/ML IO SOLN
INTRAOCULAR | Status: DC | PRN
  Administered 2017-07-28: 0.85 mL via INTRAOCULAR

## 2017-07-28 MED ORDER — EPINEPHRINE PF 1 MG/ML IJ SOLN
INTRAMUSCULAR | Status: AC
  Filled 2017-07-28: qty 1

## 2017-07-28 MED ORDER — FENTANYL CITRATE (PF) 100 MCG/2ML IJ SOLN
INTRAMUSCULAR | Status: AC
  Filled 2017-07-28: qty 2

## 2017-07-28 SURGICAL SUPPLY — 10 items
CLOTH BEACON ORANGE TIMEOUT ST (SAFETY) ×3 IMPLANT
EYE SHIELD UNIVERSAL CLEAR (GAUZE/BANDAGES/DRESSINGS) ×3 IMPLANT
GLOVE BIOGEL PI IND STRL 7.0 (GLOVE) ×1 IMPLANT
GLOVE BIOGEL PI INDICATOR 7.0 (GLOVE) ×2
LENS ALC ACRYL/TECN (Ophthalmic Related) ×3 IMPLANT
PAD ARMBOARD 7.5X6 YLW CONV (MISCELLANEOUS) ×3 IMPLANT
SYRINGE LUER LOK 1CC (MISCELLANEOUS) ×3 IMPLANT
TAPE SURG TRANSPORE 1 IN (GAUZE/BANDAGES/DRESSINGS) ×1 IMPLANT
TAPE SURGICAL TRANSPORE 1 IN (GAUZE/BANDAGES/DRESSINGS) ×2
WATER STERILE IRR 250ML POUR (IV SOLUTION) ×3 IMPLANT

## 2017-07-28 NOTE — Op Note (Signed)
Date of Admission: 07/28/2017  Date of Surgery: 07/28/2017  Pre-Op Dx: Cataract Right  Eye  Post-Op Dx: Senile Combined Cataract  Right  Eye,  Dx Code N39.767  Surgeon: Tonny Branch, M.D.  Assistants: None  Anesthesia: Topical with MAC  Indications: Painless, progressive loss of vision with compromise of daily activities.  Surgery: Cataract Extraction with Intraocular lens Implant Right Eye  Discription: The patient had dilating drops and viscous lidocaine placed into the Right eye in the pre-op holding area. After transfer to the operating room, a time out was performed. The patient was then prepped and draped. Beginning with a 70m blade a paracentesis port was made at the surgeon's 2 o'clock position. The anterior chamber was then filled with 1% non-preserved lidocaine. This was followed by filling the anterior chamber with Provisc.  A 2.467mkeratome blade was used to make a clear corneal incision at the temporal limbus.  A bent cystatome needle was used to create a continuous tear capsulotomy. Hydrodissection was performed with balanced salt solution on a Fine canula. The lens nucleus was then removed using the phacoemulsification handpiece. Residual cortex was removed with the I&A handpiece. The anterior chamber and capsular bag were refilled with Provisc. A posterior chamber intraocular lens was placed into the capsular bag with it's injector. The implant was positioned with the Kuglan hook. The Provisc was then removed from the anterior chamber and capsular bag with the I&A handpiece. Stromal hydration of the main incision and paracentesis port was performed with BSS on a Fine canula. The wounds were tested for leak which was negative. The patient tolerated the procedure well. There were no operative complications. The patient was then transferred to the recovery room in stable condition.  Complications: None  Specimen: None  EBL: None  Prosthetic device: Abbott Technis, PCB00, power  20.5, SN [id number]

## 2017-07-28 NOTE — Anesthesia Preprocedure Evaluation (Signed)
Anesthesia Evaluation  Patient identified by MRN, date of birth, ID band Patient awake    Reviewed: Allergy & Precautions, NPO status , Patient's Chart, lab work & pertinent test results, reviewed documented beta blocker date and time   Airway Mallampati: I  TM Distance: >3 FB     Dental  (+) Teeth Intact   Pulmonary former smoker,    breath sounds clear to auscultation       Cardiovascular hypertension, Pt. on home beta blockers and Pt. on medications (-) angina+ CAD and + Past MI   Rhythm:Regular Rate:Normal     Neuro/Psych    GI/Hepatic negative GI ROS, Neg liver ROS,   Endo/Other  negative endocrine ROS  Renal/GU negative Renal ROS     Musculoskeletal  (+) Arthritis ,   Abdominal   Peds  Hematology negative hematology ROS (+)   Anesthesia Other Findings   Reproductive/Obstetrics                             Anesthesia Physical  Anesthesia Plan  ASA: III  Anesthesia Plan: MAC   Post-op Pain Management:    Induction: Intravenous  PONV Risk Score and Plan:   Airway Management Planned: Nasal Cannula  Additional Equipment:   Intra-op Plan:   Post-operative Plan:   Informed Consent: I have reviewed the patients History and Physical, chart, labs and discussed the procedure including the risks, benefits and alternatives for the proposed anesthesia with the patient or authorized representative who has indicated his/her understanding and acceptance.       Plan Discussed with:   Anesthesia Plan Comments:         Anesthesia Quick Evaluation  

## 2017-07-28 NOTE — H&P (Signed)
I have reviewed the H&P, the patient was re-examined, and I have identified no interval changes in medical condition and plan of care since the history and physical of record  

## 2017-07-28 NOTE — Transfer of Care (Signed)
Immediate Anesthesia Transfer of Care Note  Patient: David Harmon  Procedure(s) Performed: Procedure(s) with comments: CATARACT EXTRACTION PHACO AND INTRAOCULAR LENS PLACEMENT RIGHT EYE (Right) - CDE: 8.33  Patient Location: PACU and Short Stay  Anesthesia Type:MAC  Level of Consciousness: awake, alert  and oriented  Airway & Oxygen Therapy: Patient Spontanous Breathing and Patient connected to nasal cannula oxygen  Post-op Assessment: Report given to RN and Post -op Vital signs reviewed and stable  Post vital signs: Reviewed and stable  Last Vitals:  Vitals:   07/28/17 1215 07/28/17 1220  BP: 129/79 135/81  Resp: 12 14  SpO2: 95% 94%    Last Pain: There were no vitals filed for this visit.       Complications: No apparent anesthesia complications

## 2017-07-29 ENCOUNTER — Encounter (HOSPITAL_COMMUNITY): Payer: Self-pay | Admitting: Ophthalmology

## 2017-07-29 NOTE — Anesthesia Postprocedure Evaluation (Signed)
Anesthesia Post Note  Patient: David Harmon  Procedure(s) Performed: Procedure(s) (LRB): CATARACT EXTRACTION PHACO AND INTRAOCULAR LENS PLACEMENT RIGHT EYE (Right)  Patient location during evaluation: Short Stay Anesthesia Type: MAC Level of consciousness: awake and alert and oriented Pain management: pain level controlled Vital Signs Assessment: post-procedure vital signs reviewed and stable Respiratory status: spontaneous breathing and respiratory function stable Cardiovascular status: stable Postop Assessment: no signs of nausea or vomiting and adequate PO intake Anesthetic complications: no     Last Vitals:  Vitals:   07/28/17 1220 07/28/17 1310  BP: 135/81 (!) 184/87  Pulse:  (!) 58  Resp: 14 16  Temp:  36.8 C  SpO2: 94% 94%    Last Pain:  Vitals:   07/28/17 1310  TempSrc: Oral                 Mehki Klumpp

## 2017-07-31 ENCOUNTER — Observation Stay (HOSPITAL_COMMUNITY)
Admission: EM | Admit: 2017-07-31 | Discharge: 2017-08-01 | Disposition: A | Discharge status: Home / Self Care | Payer: Medicare Other | Attending: Internal Medicine | Admitting: Internal Medicine

## 2017-07-31 ENCOUNTER — Emergency Department (HOSPITAL_COMMUNITY): Payer: Medicare Other

## 2017-07-31 ENCOUNTER — Other Ambulatory Visit: Payer: Self-pay

## 2017-07-31 ENCOUNTER — Encounter (HOSPITAL_COMMUNITY): Payer: Self-pay | Admitting: Emergency Medicine

## 2017-07-31 DIAGNOSIS — I2510 Atherosclerotic heart disease of native coronary artery without angina pectoris: Secondary | ICD-10-CM | POA: Insufficient documentation

## 2017-07-31 DIAGNOSIS — E782 Mixed hyperlipidemia: Secondary | ICD-10-CM

## 2017-07-31 DIAGNOSIS — Z87891 Personal history of nicotine dependence: Secondary | ICD-10-CM | POA: Insufficient documentation

## 2017-07-31 DIAGNOSIS — Z7982 Long term (current) use of aspirin: Secondary | ICD-10-CM | POA: Diagnosis not present

## 2017-07-31 DIAGNOSIS — Z79899 Other long term (current) drug therapy: Secondary | ICD-10-CM | POA: Diagnosis not present

## 2017-07-31 DIAGNOSIS — R079 Chest pain, unspecified: Principal | ICD-10-CM | POA: Diagnosis present

## 2017-07-31 DIAGNOSIS — I10 Essential (primary) hypertension: Secondary | ICD-10-CM | POA: Insufficient documentation

## 2017-07-31 LAB — BASIC METABOLIC PANEL
Anion gap: 8 (ref 5–15)
BUN: 20 mg/dL (ref 6–20)
CALCIUM: 9.4 mg/dL (ref 8.9–10.3)
CO2: 26 mmol/L (ref 22–32)
CREATININE: 0.94 mg/dL (ref 0.61–1.24)
Chloride: 105 mmol/L (ref 101–111)
GFR calc Af Amer: >60 mL/min (ref >60)
GLUCOSE: 103 mg/dL — AB (ref 65–99)
POTASSIUM: 3.6 mmol/L (ref 3.5–5.1)
SODIUM: 139 mmol/L (ref 135–145)

## 2017-07-31 LAB — CBC
HEMATOCRIT: 42.2 % (ref 39.0–52.0)
Hemoglobin: 14.3 g/dL (ref 13.0–17.0)
MCH: 30.5 pg (ref 26.0–34.0)
MCHC: 33.9 g/dL (ref 30.0–36.0)
MCV: 90 fL (ref 78.0–100.0)
PLATELETS: 247 10*3/uL (ref 150–400)
RBC: 4.69 MIL/uL (ref 4.22–5.81)
RDW: 13 % (ref 11.5–15.5)
WBC: 7 10*3/uL (ref 4.0–10.5)

## 2017-07-31 LAB — TROPONIN I

## 2017-07-31 MED ORDER — HYDRALAZINE HCL 20 MG/ML IJ SOLN: 5.0000 mg | INTRAMUSCULAR | Status: DC | PRN

## 2017-07-31 MED ORDER — NITROGLYCERIN 0.4 MG SL SUBL
0.4000 mg | SUBLINGUAL_TABLET | SUBLINGUAL | Status: DC | PRN
  Administered 2017-07-31: 0.4 mg via SUBLINGUAL
  Filled 2017-07-31: qty 1

## 2017-07-31 MED ORDER — ASPIRIN 81 MG PO CHEW
324.0000 mg | CHEWABLE_TABLET | Freq: Once | ORAL | Status: AC
  Administered 2017-07-31: 324 mg via ORAL
  Filled 2017-07-31: qty 4

## 2017-07-31 MED ORDER — MORPHINE SULFATE (PF) 4 MG/ML IV SOLN: 4.0000 mg | INTRAVENOUS | Status: DC | PRN

## 2017-07-31 NOTE — ED Provider Notes (Signed)
[facility] DEPT Provider Note   CSN: [id number] Arrival date & time: 07/31/17  1912     History   Chief Complaint Chief Complaint  Patient presents with  . Chest Pain    HPI David Harmon is a 76 y.o. male.  HPI Pt was seen at [facility]. Per pt, c/o gradual onset and persistence of multiple intermittent episodes of chest "pain" that began this morning. Pt describes the CP as "indigestion," located in his left chest and "worse then when I had my heart attack." Pt states the pain comes and goes, unclear for how long it lasts, but "at least" 10-40min or longer each episode. Endorses compliance with plavix and ASA (has cardiac stent). Denies palpitations, no SOB/cough, no abd pain, no N/V/D, no back pain, no fevers, no injury.    Past Medical History:  Diagnosis Date  . Arthritis   . Coronary atherosclerosis of native coronary artery    DES RCA 11/09 (Dr. Terrence Dupont)  . Essential hypertension, benign   . History of kidney stones   . Mixed hyperlipidemia   . Myocardial infarction [facility])    NSTEMI 11/09    Patient Active Problem List   Diagnosis Date Noted  . Coronary atherosclerosis of native coronary artery 07/10/2011  . Mixed hyperlipidemia 07/10/2011  . Essential hypertension, benign 07/10/2011    Past Surgical History:  Procedure Laterality Date  . CARDIAC CATHETERIZATION     with stent  . CATARACT EXTRACTION W/PHACO Right 07/28/2017   Procedure: CATARACT EXTRACTION PHACO AND INTRAOCULAR LENS PLACEMENT RIGHT EYE;  Surgeon: Tonny Branch, MD;  Location: [facility];  Service: Ophthalmology;  Laterality: Right;  CDE: 8.33  . EXPLORATORY LAPAROTOMY  2007   Small bowel obstruction  . Resection of pilonidal cyst      OB History    No data available       Home Medications    Prior to Admission medications   Medication Sig Start Date End Date Taking? Authorizing Provider  aspirin EC 81 MG tablet Take 81 mg by mouth daily.    [provider]  atorvastatin (LIPITOR)  40 MG tablet Take 40 mg by mouth daily.      [provider]  clopidogrel (PLAVIX) 75 MG tablet take 1 tablet by mouth once daily 02/19/17   Satira Sark, MD  lisinopril-hydrochlorothiazide (PRINZIDE,ZESTORETIC) 20-12.5 MG per tablet Take 1 tablet by mouth daily.  09/14/13   [provider]  metoprolol succinate (TOPROL-XL) 25 MG 24 hr tablet take 1 tablet by mouth once daily 05/21/17   Satira Sark, MD  Multiple Vitamins-Minerals (DAILY MENS HEALTH FORMULA PO) Take 1 tablet by mouth daily.     [provider]    Family History Family History  Problem Relation Age of Onset  . Coronary artery disease Father   . Coronary artery disease Mother   . Diabetes type II Sister     Social History Social History  Substance Use Topics  . Smoking status: Former Smoker    Packs/day: 0.50    Years: 10.00    Types: Cigarettes    Quit date: 12/16/1965  . Smokeless tobacco: Never Used     Comment: Has not smoked since 41 yrs.  . Alcohol use No     Allergies   Patient has no known allergies.   Review of Systems Review of Systems ROS: Statement: All systems negative except as marked or noted in the HPI; Constitutional: Negative for fever and chills. ; ; Eyes: Negative for  eye pain, redness and discharge. ; ; ENMT: Negative for ear pain, hoarseness, nasal congestion, sinus pressure and sore throat. ; ; Cardiovascular: +CP. Negative for palpitations, diaphoresis, dyspnea and peripheral edema. ; ; Respiratory: Negative for cough, wheezing and stridor. ; ; Gastrointestinal: Negative for nausea, vomiting, diarrhea, abdominal pain, blood in stool, hematemesis, jaundice and rectal bleeding. . ; ; Genitourinary: Negative for dysuria, flank pain and hematuria. ; ; Musculoskeletal: Negative for back pain and neck pain. Negative for swelling and trauma.; ; Skin: Negative for pruritus, rash, abrasions, blisters, bruising and skin lesion.; ; Neuro: Negative for headache,  lightheadedness and neck stiffness. Negative for weakness, altered level of consciousness, altered mental status, extremity weakness, paresthesias, involuntary movement, seizure and syncope.       Physical Exam Updated Vital Signs BP (!) 166/88   Pulse 64   Temp 98.4 F (36.9 C) (Oral)   Resp 16   Ht 5\' 10"  (1.778 m)   Wt 81.6 kg (180 lb)   SpO2 99%   BMI 25.83 kg/m    BP 132/89   Pulse 60   Temp 98.4 F (36.9 C) (Oral)   Resp 17   Ht 5\' 10"  (1.778 m)   Wt 81.6 kg (180 lb)   SpO2 96%   BMI 25.83 kg/m    Physical Exam 2000: Physical examination:  Nursing notes reviewed; Vital signs and O2 SAT reviewed;  Constitutional: Well developed, Well nourished, Well hydrated, In no acute distress; Head:  Normocephalic, atraumatic; Eyes: EOMI, PERRL, No scleral icterus; ENMT: Mouth and pharynx normal, Mucous membranes moist; Neck: Supple, Full range of motion, No lymphadenopathy; Cardiovascular: Regular rate and rhythm, No gallop; Respiratory: Breath sounds clear & equal bilaterally, No wheezes.  Speaking full sentences with ease, Normal respiratory effort/excursion; Chest: Nontender, Movement normal; Abdomen: Soft, Nontender, Nondistended, Normal bowel sounds; Genitourinary: No CVA tenderness; Extremities: Pulses normal, No tenderness, No edema, No calf edema or asymmetry.; Neuro: AA&Ox3, Major CN grossly intact.  Speech clear. No gross focal motor or sensory deficits in extremities.; Skin: Color normal, Warm, Dry.   ED Treatments / Results  Labs (all labs ordered are listed, but only abnormal results are displayed)   EKG  EKG Interpretation  Date/Time:  Thursday July 31 2017 19:18:04 EDT Ventricular Rate:  72 PR Interval:  188 QRS Duration: 152 QT Interval:  432 QTC Calculation: 473 R Axis:   -70 Text Interpretation:  Normal sinus rhythm Right bundle branch block Left anterior fascicular block  Bifascicular block  Moderate voltage criteria for LVH, may be normal variant  Cannot rule out Septal infarct , age undetermined When compared with ECG of 07/23/2017 No significant change was found Confirmed by Francine Graven [id number]) on 07/31/2017 8:09:18 PM       Radiology   Procedures Procedures (including critical care time)  Medications Ordered in ED Medications  nitroGLYCERIN (NITROSTAT) SL tablet 0.4 mg (0.4 mg Sublingual Given 07/31/17 2018)  morphine 4 MG/ML injection 4 mg (not administered)  aspirin chewable tablet 324 mg (324 mg Oral Given 07/31/17 2015)     Initial Impression / Assessment and Plan / ED Course  I have reviewed the triage vital signs and the nursing notes.  Pertinent labs & imaging results that were available during my care of the patient were reviewed by me and considered in my medical decision making (see chart for details).  MDM Reviewed: previous chart, nursing note and vitals Reviewed previous: labs and ECG Interpretation: labs, ECG and x-ray   Results for orders  placed or performed during the hospital encounter of 03/55/97  Basic metabolic panel  Result Value Ref Range   Sodium 139 135 - 145 mmol/L   Potassium 3.6 3.5 - 5.1 mmol/L   Chloride 105 101 - 111 mmol/L   CO2 26 22 - 32 mmol/L   Glucose, Bld 103 (H) 65 - 99 mg/dL   BUN 20 6 - 20 mg/dL   Creatinine, Ser 0.94 0.61 - 1.24 mg/dL   Calcium 9.4 8.9 - 10.3 mg/dL   GFR calc non Af Amer >60 >60 mL/min   GFR calc Af Amer >60 >60 mL/min   Anion gap 8 5 - 15  CBC  Result Value Ref Range   WBC 7.0 4.0 - 10.5 K/uL   RBC 4.69 4.22 - 5.81 MIL/uL   Hemoglobin 14.3 13.0 - 17.0 g/dL   HCT 42.2 39.0 - 52.0 %   MCV 90.0 78.0 - 100.0 fL   MCH 30.5 26.0 - 34.0 pg   MCHC 33.9 30.0 - 36.0 g/dL   RDW 13.0 11.5 - 15.5 %   Platelets 247 150 - 400 K/uL  Troponin I  Result Value Ref Range   Troponin I <0.03 <0.03 ng/mL   Dg Chest 2 View Result Date: 07/31/2017 CLINICAL DATA:  Intermittent left-sided chest pain today. EXAM: CHEST  2 VIEW COMPARISON:  Radiograph 03/24/2010  FINDINGS: Atherosclerosis of the aortic arch. The cardiomediastinal contours are normal. The lungs are clear. Pulmonary vasculature is normal. No consolidation, pleural effusion, or pneumothorax. No acute osseous abnormalities are seen. IMPRESSION: 1. No acute abnormality. 2. Atherosclerosis of the thoracic aorta. Electronically Signed   By: Jeb Levering M.D.   On: 07/31/2017 19:35    2110:  ASA and SL ntg given with improvement. Dx and testing d/w pt and family.  Questions answered.  Verb understanding, agreeable to admit. T/C to [facility] Dr. Lorin Mercy, case discussed, including:  HPI, pertinent PM/SHx, VS/PE, dx testing, ED course and treatment:  Agreeable to admit.   Final Clinical Impressions(s) / ED Diagnoses   Final diagnoses:  None    New Prescriptions New Prescriptions   No medications on file     Francine Graven, DO 08/03/17 1326

## 2017-07-31 NOTE — H&P (Signed)
History and Physical    David Harmon GEX:[id number] DOB: 12/29/1940 DOA: 07/31/2017  PCP: David Bis, MD Consultants:  David Harmon - cardiology; eye doctor (cataract removed Monday) Patient coming from: Home - lives with wife and daughter; David Harmon: wife, wife or daughter, [phone]  Chief Complaint: chest pain  HPI: David Harmon is a 76 y.o. male with medical history significant of NSTEMI requiring RCA stent in 2009; HLD; HTN; and arthritis presenting with chest pain.  "I had a little chest pain and everybody thought it be best if I come over here."  He has had chest pain off and on all day.  He mowed the grass for 4 1/2 hours today (riding mower) and did not notice exertion pain.  Has had some indigestion today, which is unusual for him.  Pain was right behind the left nipple.  He didn't eat much dinner.  Previous MI-related chest pain was very different from today - exertional.  No associated SOB, nausea, diaphoresis today.  NTG helped briefly with the pain.  Pain is present now, feels like he needs to eat.  Pain was 2-3/10 at its worst, currently 2/10.   ED Course: Chest pain improved with ASA/NTG.    Review of Systems: As per HPI; otherwise review of systems reviewed and negative.   Ambulatory Status:   Ambulates without assistance  Past Medical History:  Diagnosis Date  . Arthritis   . Coronary atherosclerosis of native coronary artery    DES RCA 11/09 (Dr. Terrence Harmon)  . Essential hypertension, benign   . History of kidney stones   . Mixed hyperlipidemia   . Myocardial infarction [facility])    NSTEMI 11/09    Past Surgical History:  Procedure Laterality Date  . CARDIAC CATHETERIZATION     with stent  . CATARACT EXTRACTION W/PHACO Right 07/28/2017   Procedure: CATARACT EXTRACTION PHACO AND INTRAOCULAR LENS PLACEMENT RIGHT EYE;  Surgeon: David Branch, MD;  Location: [facility];  Service: Ophthalmology;  Laterality: Right;  CDE: 8.33  . EXPLORATORY LAPAROTOMY  2007   Small bowel  obstruction  . Resection of pilonidal cyst      Social History   Social History  . Marital status: Married    Spouse name: N/A  . Number of children: N/A  . Years of education: N/A   Occupational History  . Truck driver    Social History Main Topics  . Smoking status: Former Smoker    Packs/day: 0.50    Years: 10.00    Types: Cigarettes    Quit date: 12/16/1965  . Smokeless tobacco: Never Used     Comment: Has not smoked since 41 yrs.  . Alcohol use No  . Drug use: No  . Sexual activity: Yes    Birth control/ protection: None   Other Topics Concern  . Not on file   Social History Narrative  . No narrative on file    No Known Allergies  Family History  Problem Relation Age of Onset  . Coronary artery disease Father   . Coronary artery disease Mother   . Diabetes type II Sister     Prior to Admission medications   Medication Sig Start Date End Date Taking? Authorizing Provider  acetaminophen (TYLENOL) 500 MG tablet Take 1,000 mg by mouth every 6 (six) hours as needed for mild pain or moderate pain.   Yes [provider]  aspirin EC 81 MG tablet Take 81 mg by mouth daily.   Yes [provider]  atorvastatin (LIPITOR) 40 MG tablet Take 40 mg by mouth daily.     Yes [provider]  clopidogrel (PLAVIX) 75 MG tablet take 1 tablet by mouth once daily 02/19/17  Yes David Sark, MD  lisinopril-hydrochlorothiazide (PRINZIDE,ZESTORETIC) 20-12.5 MG per tablet Take 1 tablet by mouth daily.  09/14/13  Yes [provider]  metoprolol succinate (TOPROL-XL) 25 MG 24 hr tablet take 1 tablet by mouth once daily 05/21/17  Yes David Sark, MD  Multiple Vitamins-Minerals (DAILY MENS HEALTH FORMULA PO) Take 1 tablet by mouth daily.    Yes [provider]  UNKNOWN TO PATIENT Place 1 drop into both eyes 2 (two) times daily.   Yes [provider]    Physical Exam: Vitals:   07/31/17 2045 07/31/17 2100 07/31/17 2115 07/31/17  2136  BP:  (!) 148/87  (!) 169/94  Pulse: 60 (!) 59 (!) 58 63  Resp: 17 15 13    Temp:      TempSrc:      SpO2: 96% 99% 97% 98%  Weight:      Height:         General: Appears calm and comfortable and is NAD Eyes:  PERRL, EOMI, normal lids, iris ENT:  grossly normal hearing, lips & tongue, mmm; appropriate dentition Neck:  no LAD, masses or thyromegaly; no carotid bruits Cardiovascular:  RRR, no m/r/g. No LE edema.  Respiratory:   CTA bilaterally with no wheezes/rales/rhonchi.  Normal respiratory effort. Abdomen:  soft, NT, ND, NABS Back:   normal alignment, no CVAT Skin:  no rash or induration seen on limited exam Musculoskeletal:  grossly normal tone BUE/BLE, good ROM, no bony abnormality Psychiatric:  grossly normal mood and affect, speech fluent and appropriate, AOx3 Neurologic:  CN 2-12 grossly intact, moves all extremities in coordinated fashion, sensation intact    Radiological Exams on Admission: Dg Chest 2 View  Result Date: 07/31/2017 CLINICAL DATA:  Intermittent left-sided chest pain today. EXAM: CHEST  2 VIEW COMPARISON:  Radiograph 03/24/2010 FINDINGS: Atherosclerosis of the aortic arch. The cardiomediastinal contours are normal. The lungs are clear. Pulmonary vasculature is normal. No consolidation, pleural effusion, or pneumothorax. No acute osseous abnormalities are seen. IMPRESSION: 1. No acute abnormality. 2. Atherosclerosis of the thoracic aorta. Electronically Signed   By: David Harmon M.D.   On: 07/31/2017 19:35    EKG: Independently reviewed.  NSR with rate 72; LVH and bifascicular block with no evidence of acute ischemia   Labs on Admission: I have personally reviewed the available labs and imaging studies at the time of the admission.  Pertinent labs:   Glucose 103 Troponin <0.03  Assessment/Plan Principal Problem:   Chest pain Active Problems:   Mixed hyperlipidemia   Essential hypertension, benign   Chest pain -Patient with left-sided  sharp chest pain that has come on intermittently throughout the day, non-exertional, brief relief with ASA/NTG but pain recurred -1/3 typical symptoms suggestive of noncardiac chest pain.  -CXR unremarkable.   -Initial cardiac troponin negative.  -EKG not indicative of acute ischemia.   -GRACE score is 95; which predicts an in-hospital death rate of 0.7%.  -Will plan to place in observation status on telemetry to rule out ACS by overnight observation.  -cycle troponin q6h x 3 and repeat EKG in AM -Continue ASA 81 mg and Plavix daily -morphine given -Risk factor stratification with HgbA1c and FLP; will also check TSH  -Cardiology consultation in AM - NPO for possible stress test  -Will plan to start  Heparin drip if enzymes are positive -Does not appear to be [place] despite persistent chest pain due to the nature of the pain.  HTN -Takes Lisinopril-HCTZ and Toprol XL at home -Patient with suboptimal control while in the ER -Will add prn hydralazine  HLD -Continue Lipitor 40 mg -Lipids were last checked in Epic in 2009; will order FLP   DVT prophylaxis:  Lovenox  Code Status:  Full - confirmed with patient/family Family Communication: Wife and daughter present throughout evaluation  Disposition Plan:  Home once clinically improved Consults called: Cardiology  Admission status: It is my clinical opinion that referral for OBSERVATION is reasonable and necessary in this patient based on the above information provided. The aforementioned taken together are felt to place the patient at high risk for further clinical deterioration. However it is anticipated that the patient may be medically stable for discharge from the hospital within 24 to 48 hours.     Karmen Bongo MD [facility]  If note is complete, please contact covering daytime or nighttime physician. [email or web address] Password [facility]  07/31/2017, 10:04 PM

## 2017-07-31 NOTE — ED Triage Notes (Signed)
Pt c/o intermittent chest pain all day.

## 2017-07-31 NOTE — ED Notes (Signed)
Pt refused Morphine and refused ntg for dull CP which pt rates 1/10 at present

## 2017-08-01 ENCOUNTER — Observation Stay (HOSPITAL_COMMUNITY): Payer: Medicare Other

## 2017-08-01 DIAGNOSIS — E782 Mixed hyperlipidemia: Secondary | ICD-10-CM | POA: Diagnosis not present

## 2017-08-01 DIAGNOSIS — I200 Unstable angina: Secondary | ICD-10-CM

## 2017-08-01 DIAGNOSIS — I25118 Atherosclerotic heart disease of native coronary artery with other forms of angina pectoris: Secondary | ICD-10-CM

## 2017-08-01 DIAGNOSIS — K219 Gastro-esophageal reflux disease without esophagitis: Secondary | ICD-10-CM | POA: Diagnosis not present

## 2017-08-01 DIAGNOSIS — Z955 Presence of coronary angioplasty implant and graft: Secondary | ICD-10-CM | POA: Diagnosis not present

## 2017-08-01 DIAGNOSIS — R079 Chest pain, unspecified: Secondary | ICD-10-CM | POA: Diagnosis not present

## 2017-08-01 DIAGNOSIS — I10 Essential (primary) hypertension: Secondary | ICD-10-CM | POA: Diagnosis not present

## 2017-08-01 LAB — TROPONIN I

## 2017-08-01 LAB — LIPID PANEL
CHOL/HDL RATIO: 3.6 ratio
Cholesterol: 138 mg/dL (ref 0–200)
HDL: 38 mg/dL — ABNORMAL LOW (ref >40)
LDL CALC: 75 mg/dL (ref 0–99)
Triglycerides: 125 mg/dL (ref <150)
VLDL: 25 mg/dL (ref 0–40)

## 2017-08-01 LAB — HEMOGLOBIN A1C
Hgb A1c MFr Bld: 5.2 % (ref 4.8–5.6)
Mean Plasma Glucose: 102.54 mg/dL

## 2017-08-01 LAB — MAGNESIUM: MAGNESIUM: 2.1 mg/dL (ref 1.7–2.4)

## 2017-08-01 LAB — D-DIMER, QUANTITATIVE: D-Dimer, Quant: <0.27 ug/mL-FEU (ref 0.00–0.50)

## 2017-08-01 LAB — TSH: TSH: 5.167 u[IU]/mL — AB (ref 0.350–4.500)

## 2017-08-01 MED ORDER — ATORVASTATIN CALCIUM 40 MG PO TABS
40.0000 mg | ORAL_TABLET | Freq: Every day | ORAL | Status: DC
  Administered 2017-08-01: 40 mg via ORAL
  Filled 2017-08-01: qty 1

## 2017-08-01 MED ORDER — ACETAMINOPHEN 325 MG PO TABS: 650.0000 mg | ORAL_TABLET | ORAL | Status: DC | PRN

## 2017-08-01 MED ORDER — ASPIRIN EC 81 MG PO TBEC
81.0000 mg | DELAYED_RELEASE_TABLET | Freq: Every day | ORAL | Status: DC
  Administered 2017-08-01: 81 mg via ORAL
  Filled 2017-08-01: qty 1

## 2017-08-01 MED ORDER — ENOXAPARIN SODIUM 40 MG/0.4ML ~~LOC~~ SOLN
40.0000 mg | SUBCUTANEOUS | Status: DC
  Administered 2017-08-01: 40 mg via SUBCUTANEOUS
  Filled 2017-08-01: qty 0.4

## 2017-08-01 MED ORDER — POTASSIUM CHLORIDE CRYS ER 20 MEQ PO TBCR
20.0000 meq | EXTENDED_RELEASE_TABLET | Freq: Once | ORAL | Status: AC
  Administered 2017-08-01: 20 meq via ORAL
  Filled 2017-08-01: qty 1

## 2017-08-01 MED ORDER — LISINOPRIL-HYDROCHLOROTHIAZIDE 20-12.5 MG PO TABS: 1.0000 | ORAL_TABLET | Freq: Every day | ORAL | Status: DC

## 2017-08-01 MED ORDER — GI COCKTAIL ~~LOC~~: 30.0000 mL | Freq: Four times a day (QID) | ORAL | Status: DC | PRN

## 2017-08-01 MED ORDER — PANTOPRAZOLE SODIUM 40 MG PO TBEC
40.0000 mg | DELAYED_RELEASE_TABLET | Freq: Every day | ORAL | Status: DC
  Administered 2017-08-01: 40 mg via ORAL
  Filled 2017-08-01: qty 1

## 2017-08-01 MED ORDER — ONDANSETRON HCL 4 MG/2ML IJ SOLN: 4.0000 mg | Freq: Four times a day (QID) | INTRAMUSCULAR | Status: DC | PRN

## 2017-08-01 MED ORDER — PANTOPRAZOLE SODIUM 40 MG PO TBEC: 40.0000 mg | DELAYED_RELEASE_TABLET | Freq: Every day | ORAL | 1 refills | Status: AC

## 2017-08-01 MED ORDER — HYDROCHLOROTHIAZIDE 12.5 MG PO CAPS
12.5000 mg | ORAL_CAPSULE | Freq: Every day | ORAL | Status: DC
  Administered 2017-08-01: 12.5 mg via ORAL
  Filled 2017-08-01: qty 1

## 2017-08-01 MED ORDER — METOPROLOL SUCCINATE ER 25 MG PO TB24
25.0000 mg | ORAL_TABLET | Freq: Every day | ORAL | Status: DC
  Administered 2017-08-01: 25 mg via ORAL
  Filled 2017-08-01: qty 1

## 2017-08-01 MED ORDER — CLOPIDOGREL BISULFATE 75 MG PO TABS
75.0000 mg | ORAL_TABLET | Freq: Every day | ORAL | Status: DC
  Administered 2017-08-01: 75 mg via ORAL
  Filled 2017-08-01: qty 1

## 2017-08-01 MED ORDER — ADULT MULTIVITAMIN W/MINERALS CH
ORAL_TABLET | Freq: Every day | ORAL | Status: DC
  Administered 2017-08-01: 1 via ORAL
  Filled 2017-08-01: qty 1

## 2017-08-01 MED ORDER — MORPHINE SULFATE (PF) 2 MG/ML IV SOLN: 2.0000 mg | INTRAVENOUS | Status: DC | PRN

## 2017-08-01 MED ORDER — LISINOPRIL 10 MG PO TABS
20.0000 mg | ORAL_TABLET | Freq: Every day | ORAL | Status: DC
  Administered 2017-08-01: 20 mg via ORAL
  Filled 2017-08-01: qty 2

## 2017-08-01 NOTE — Progress Notes (Deleted)
Physician Discharge Summary  David Harmon MRN: [id number] DOB/AGE: 75-14-1942 75 y.o.  PCP: Caryl Bis, MD   Admit date: 07/31/2017 Discharge date: 08/01/2017  Discharge Diagnoses:    Principal Problem:   Chest pain Active Problems:   Mixed hyperlipidemia   Essential hypertension, benign    Follow-up recommendations Follow-up with PCP in 3-5 days , including all  additional recommended appointments as below Follow-up CBC, CMP in 3-5 days       Current Discharge Medication List    START taking these medications   Details  pantoprazole (PROTONIX) 40 MG tablet Take 1 tablet (40 mg total) by mouth daily. Qty: 30 tablet, Refills: 1      CONTINUE these medications which have NOT CHANGED   Details  acetaminophen (TYLENOL) 500 MG tablet Take 1,000 mg by mouth every 6 (six) hours as needed for mild pain or moderate pain.    aspirin EC 81 MG tablet Take 81 mg by mouth daily.    atorvastatin (LIPITOR) 40 MG tablet Take 40 mg by mouth daily.      clopidogrel (PLAVIX) 75 MG tablet take 1 tablet by mouth once daily Qty: 90 tablet, Refills: 1    lisinopril-hydrochlorothiazide (PRINZIDE,ZESTORETIC) 20-12.5 MG per tablet Take 1 tablet by mouth daily.     metoprolol succinate (TOPROL-XL) 25 MG 24 hr tablet take 1 tablet by mouth once daily Qty: 90 tablet, Refills: 3    Multiple Vitamins-Minerals (DAILY MENS HEALTH FORMULA PO) Take 1 tablet by mouth daily.     UNKNOWN TO PATIENT Place 1 drop into both eyes 2 (two) times daily.         Discharge Condition:  Stable   Discharge Instructions Get Medicines reviewed and adjusted: Please take all your medications with you for your next visit with your Primary MD  Please request your Primary MD to go over all hospital tests and procedure/radiological results at the follow up, please ask your Primary MD to get all Hospital records sent to his/her office.  If you experience worsening of your admission symptoms, develop  shortness of breath, life threatening emergency, suicidal or homicidal thoughts you must seek medical attention immediately by calling 911 or calling your MD immediately if symptoms less severe.  You must read complete instructions/literature along with all the possible adverse reactions/side effects for all the Medicines you take and that have been prescribed to you. Take any new Medicines after you have completely understood and accpet all the possible adverse reactions/side effects.   Do not drive when taking Pain medications.   Do not take more than prescribed Pain, Sleep and Anxiety Medications  Special Instructions: If you have smoked or chewed Tobacco in the last 2 yrs please stop smoking, stop any regular Alcohol and or any Recreational drug use.  Wear Seat belts while driving.  Please note  You were cared for by a hospitalist during your hospital stay. Once you are discharged, your primary care physician will handle any further medical issues. Please note that NO REFILLS for any discharge medications will be authorized once you are discharged, as it is imperative that you return to your primary care physician (or establish a relationship with a primary care physician if you do not have one) for your aftercare needs so that they can reassess your need for medications and monitor your lab values.     No Known Allergies    Disposition: 01-Home or Self Care   Consults: * Cardiology    Significant Diagnostic Studies:  Dg Chest 2 View  Result Date: 07/31/2017 CLINICAL DATA:  Intermittent left-sided chest pain today. EXAM: CHEST  2 VIEW COMPARISON:  Radiograph 03/24/2010 FINDINGS: Atherosclerosis of the aortic arch. The cardiomediastinal contours are normal. The lungs are clear. Pulmonary vasculature is normal. No consolidation, pleural effusion, or pneumothorax. No acute osseous abnormalities are seen. IMPRESSION: 1. No acute abnormality. 2. Atherosclerosis of the thoracic  aorta. Electronically Signed   By: Jeb Levering M.D.   On: 07/31/2017 19:35       Filed Weights   07/31/17 1914 08/01/17 0049  Weight: 81.6 kg (180 lb) 77.6 kg (171 lb 1.2 oz)     Microbiology: No results found for this or any previous visit (from the past 240 hour(s)).     Blood Culture No results found for: SDES, SPECREQUEST, CULT, REPTSTATUS    Labs: Results for orders placed or performed during the hospital encounter of 07/31/17 (from the past 48 hour(s))  Basic metabolic panel     Status: Abnormal   Collection Time: 07/31/17  7:17 PM  Result Value Ref Range   Sodium 139 135 - 145 mmol/L   Potassium 3.6 3.5 - 5.1 mmol/L   Chloride 105 101 - 111 mmol/L   CO2 26 22 - 32 mmol/L   Glucose, Bld 103 (H) 65 - 99 mg/dL   BUN 20 6 - 20 mg/dL   Creatinine, Ser 0.94 0.61 - 1.24 mg/dL   Calcium 9.4 8.9 - 10.3 mg/dL   GFR calc non Af Amer >60 >60 mL/min   GFR calc Af Amer >60 >60 mL/min    Comment: (NOTE) The eGFR has been calculated using the CKD EPI equation. This calculation has not been validated in all clinical situations. eGFR's persistently <60 mL/min signify possible Chronic Kidney Disease.    Anion gap 8 5 - 15  CBC     Status: None   Collection Time: 07/31/17  7:17 PM  Result Value Ref Range   WBC 7.0 4.0 - 10.5 K/uL   RBC 4.69 4.22 - 5.81 MIL/uL   Hemoglobin 14.3 13.0 - 17.0 g/dL   HCT 42.2 39.0 - 52.0 %   MCV 90.0 78.0 - 100.0 fL   MCH 30.5 26.0 - 34.0 pg   MCHC 33.9 30.0 - 36.0 g/dL   RDW 13.0 11.5 - 15.5 %   Platelets 247 150 - 400 K/uL  Troponin I     Status: None   Collection Time: 07/31/17  7:17 PM  Result Value Ref Range   Troponin I <0.03 <0.03 ng/mL  TSH     Status: Abnormal   Collection Time: 07/31/17  7:17 PM  Result Value Ref Range   TSH 5.167 (H) 0.350 - 4.500 uIU/mL    Comment: Performed by a 3rd Generation assay with a functional sensitivity of <=0.01 uIU/mL.  Troponin I-serum (0, 3, 6 hours)     Status: None   Collection Time:  08/01/17  1:21 AM  Result Value Ref Range   Troponin I <0.03 <0.03 ng/mL  Troponin I-serum (0, 3, 6 hours)     Status: None   Collection Time: 08/01/17  6:29 AM  Result Value Ref Range   Troponin I <0.03 <0.03 ng/mL  Lipid panel     Status: Abnormal   Collection Time: 08/01/17  6:29 AM  Result Value Ref Range   Cholesterol 138 0 - 200 mg/dL   Triglycerides 125 <150 mg/dL   HDL 38 (L) >40 mg/dL   Total CHOL/HDL Ratio 3.6 RATIO  VLDL 25 0 - 40 mg/dL   LDL Cholesterol 75 0 - 99 mg/dL    Comment:        Total Cholesterol/HDL:CHD Risk Coronary Heart Disease Risk Table                     Men   Women  1/2 Average Risk   3.4   3.3  Average Risk       5.0   4.4  2 X Average Risk   9.6   7.1  3 X Average Risk  23.4   11.0        Use the calculated Patient Ratio above and the CHD Risk Table to determine the patient's CHD Risk.        ATP III CLASSIFICATION (LDL):  <100     mg/dL   Optimal  100-129  mg/dL   Near or Above                    Optimal  130-159  mg/dL   Borderline  160-189  mg/dL   High  >190     mg/dL   Very High   Magnesium     Status: None   Collection Time: 08/01/17  9:09 AM  Result Value Ref Range   Magnesium 2.1 1.7 - 2.4 mg/dL     Lipid Panel     Component Value Date/Time   CHOL 138 08/01/2017 0629   TRIG 125 08/01/2017 0629   HDL 38 (L) 08/01/2017 0629   CHOLHDL 3.6 08/01/2017 0629   VLDL 25 08/01/2017 0629   LDLCALC 75 08/01/2017 0629       HPI :  76 y.o. male with a hx of  Coronary artery disease, with drug-eluting stent to the right coronary artery in November 2009, other history includes hypertension, hyperlipidemia, who is being seen today for the evaluation of recurrent chest pain .apparently had chest discomfort on and off all day after mowing the grass using a riding mower. He also had complaints of indigestion which is not normal for him to have. On arrival to the emergency room he was found to be hypertensive with a blood pressure 188/92,  heart rate 77, O2 sat 98% he was afebrile. Pertinent labs include a potassium of 3.6 (should be 4.0 in cardiac patients), he is not found to be anemic. Troponin negative 3. EKG revealed normal sinus rhythm with right bundle-branch block and left anterior fascicular block, heart of 64 bpm, (unchanged from prior EKGs).. Chest x-ray was negative for CHF or pneumonia. History with aspirin and sublingual nitroglycerin. Admitted to rule out ACS.  HOSPITAL COURSE:   Chest pain Cardiac enzymes negative with atypical features  D-dimer.........Marland Kitchen Describes pain as being heartburn, started on PPI Evaluated by cardiology, scheduled for stress test   CAD: Hx of stent to RCA in 2008. Continue metoprolol, lisinopril, statin therapy. He remains on aspirin and Plavix.. . Schedule for nuclear stress test  Hypertension: Blood pressure elevated in the ED. Review of home medications include  lisinopril/ HCTZ ,20/12.5 mg. May need to increase for better control  , could consider adding Norvasc   Hypercholesterolemia: Remains on statin therapy. Would recommend stricter control of low-cholesterol diet based upon the food he states he's been eating at home. LDL 75  Discharge Exam: *  Blood pressure (!) 161/77, pulse 66, temperature 97.9 F (36.6 C), temperature source Oral, resp. rate 18, height _0  (1.778 m), weight 77.6 kg (171 lb 1.2 oz),  SpO2 97 %.  Cardiac:  normal S1, S2; RRR; no murmur  Lungs:  clear to auscultation bilaterally, no wheezing, rhonchi or rales  Abd: soft, nontender, no hepatomegaly  Ext: no edema Musculoskeletal:  No deformities, BUE and BLE strength normal and equal Skin: warm and dry  Neuro:  CNs 2-12 intact, no focal abnormalities noted Psych:  Normal affect     Follow-up Information    Caryl Bis, MD. Call.   Specialty:  Family Medicine Why:  Hospital follow-up in 3-5 days Contact information: [place] [address] [phone]            Signed: Reyne Dumas 08/01/2017, 10:00 AM        Time spent >1 hour

## 2017-08-01 NOTE — Progress Notes (Signed)
Discharge instructions given, verbalized understanding, out in stable condition ambulatory with staff. 

## 2017-08-01 NOTE — Discharge Summary (Signed)
Physician Discharge Summary  David Harmon MRN: [id number] DOB/AGE: 1940-12-19 76 y.o.  PCP: Caryl Bis, MD   Admit date: 07/31/2017 Discharge date: 08/01/2017  Discharge Diagnoses:    Principal Problem:   Chest pain Active Problems:   Mixed hyperlipidemia   Essential hypertension, benign    Follow-up recommendations Follow-up with PCP in 3-5 days , including all  additional recommended appointments as below Follow-up CBC, CMP in 3-5 days follow up with his cardiologist, Dr. Domenic Polite, as scheduled in September 2018.       Current Discharge Medication List    START taking these medications   Details  pantoprazole (PROTONIX) 40 MG tablet Take 1 tablet (40 mg total) by mouth daily. Qty: 30 tablet, Refills: 1      CONTINUE these medications which have NOT CHANGED   Details  acetaminophen (TYLENOL) 500 MG tablet Take 1,000 mg by mouth every 6 (six) hours as needed for mild pain or moderate pain.    aspirin EC 81 MG tablet Take 81 mg by mouth daily.    atorvastatin (LIPITOR) 40 MG tablet Take 40 mg by mouth daily.      clopidogrel (PLAVIX) 75 MG tablet take 1 tablet by mouth once daily Qty: 90 tablet, Refills: 1    lisinopril-hydrochlorothiazide (PRINZIDE,ZESTORETIC) 20-12.5 MG per tablet Take 1 tablet by mouth daily.     metoprolol succinate (TOPROL-XL) 25 MG 24 hr tablet take 1 tablet by mouth once daily Qty: 90 tablet, Refills: 3    Multiple Vitamins-Minerals (DAILY MENS HEALTH FORMULA PO) Take 1 tablet by mouth daily.     UNKNOWN TO PATIENT Place 1 drop into both eyes 2 (two) times daily.         Discharge Condition:  Stable   Discharge Instructions Get Medicines reviewed and adjusted: Please take all your medications with you for your next visit with your Primary MD  Please request your Primary MD to go over all hospital tests and procedure/radiological results at the follow up, please ask your Primary MD to get all Hospital records sent to  his/her office.  If you experience worsening of your admission symptoms, develop shortness of breath, life threatening emergency, suicidal or homicidal thoughts you must seek medical attention immediately by calling 911 or calling your MD immediately if symptoms less severe.  You must read complete instructions/literature along with all the possible adverse reactions/side effects for all the Medicines you take and that have been prescribed to you. Take any new Medicines after you have completely understood and accpet all the possible adverse reactions/side effects.   Do not drive when taking Pain medications.   Do not take more than prescribed Pain, Sleep and Anxiety Medications  Special Instructions: If you have smoked or chewed Tobacco in the last 2 yrs please stop smoking, stop any regular Alcohol and or any Recreational drug use.  Wear Seat belts while driving.  Please note  You were cared for by a hospitalist during your hospital stay. Once you are discharged, your primary care physician will handle any further medical issues. Please note that NO REFILLS for any discharge medications will be authorized once you are discharged, as it is imperative that you return to your primary care physician (or establish a relationship with a primary care physician if you do not have one) for your aftercare needs so that they can reassess your need for medications and monitor your lab values.     No Known Allergies    Disposition: 01-Home or Self  Care   Consults: * Cardiology    Significant Diagnostic Studies:  Dg Chest 2 View  Result Date: 07/31/2017 CLINICAL DATA:  Intermittent left-sided chest pain today. EXAM: CHEST  2 VIEW COMPARISON:  Radiograph 03/24/2010 FINDINGS: Atherosclerosis of the aortic arch. The cardiomediastinal contours are normal. The lungs are clear. Pulmonary vasculature is normal. No consolidation, pleural effusion, or pneumothorax. No acute osseous abnormalities  are seen. IMPRESSION: 1. No acute abnormality. 2. Atherosclerosis of the thoracic aorta. Electronically Signed   By: Jeb Levering M.D.   On: 07/31/2017 19:35       Filed Weights   07/31/17 1914 08/01/17 0049  Weight: 81.6 kg (180 lb) 77.6 kg (171 lb 1.2 oz)     Microbiology: No results found for this or any previous visit (from the past 240 hour(s)).     Blood Culture No results found for: SDES, SPECREQUEST, CULT, REPTSTATUS    Labs: Results for orders placed or performed during the hospital encounter of 07/31/17 (from the past 48 hour(s))  Basic metabolic panel     Status: Abnormal   Collection Time: 07/31/17  7:17 PM  Result Value Ref Range   Sodium 139 135 - 145 mmol/L   Potassium 3.6 3.5 - 5.1 mmol/L   Chloride 105 101 - 111 mmol/L   CO2 26 22 - 32 mmol/L   Glucose, Bld 103 (H) 65 - 99 mg/dL   BUN 20 6 - 20 mg/dL   Creatinine, Ser 0.94 0.61 - 1.24 mg/dL   Calcium 9.4 8.9 - 10.3 mg/dL   GFR calc non Af Amer >60 >60 mL/min   GFR calc Af Amer >60 >60 mL/min    Comment: (NOTE) The eGFR has been calculated using the CKD EPI equation. This calculation has not been validated in all clinical situations. eGFR's persistently <60 mL/min signify possible Chronic Kidney Disease.    Anion gap 8 5 - 15  CBC     Status: None   Collection Time: 07/31/17  7:17 PM  Result Value Ref Range   WBC 7.0 4.0 - 10.5 K/uL   RBC 4.69 4.22 - 5.81 MIL/uL   Hemoglobin 14.3 13.0 - 17.0 g/dL   HCT 42.2 39.0 - 52.0 %   MCV 90.0 78.0 - 100.0 fL   MCH 30.5 26.0 - 34.0 pg   MCHC 33.9 30.0 - 36.0 g/dL   RDW 13.0 11.5 - 15.5 %   Platelets 247 150 - 400 K/uL  Troponin I     Status: None   Collection Time: 07/31/17  7:17 PM  Result Value Ref Range   Troponin I <0.03 <0.03 ng/mL  TSH     Status: Abnormal   Collection Time: 07/31/17  7:17 PM  Result Value Ref Range   TSH 5.167 (H) 0.350 - 4.500 uIU/mL    Comment: Performed by a 3rd Generation assay with a functional sensitivity of <=0.01  uIU/mL.  Troponin I-serum (0, 3, 6 hours)     Status: None   Collection Time: 08/01/17  1:21 AM  Result Value Ref Range   Troponin I <0.03 <0.03 ng/mL  Troponin I-serum (0, 3, 6 hours)     Status: None   Collection Time: 08/01/17  6:29 AM  Result Value Ref Range   Troponin I <0.03 <0.03 ng/mL  Lipid panel     Status: Abnormal   Collection Time: 08/01/17  6:29 AM  Result Value Ref Range   Cholesterol 138 0 - 200 mg/dL   Triglycerides 125 <150 mg/dL  HDL 38 (L) >40 mg/dL   Total CHOL/HDL Ratio 3.6 RATIO   VLDL 25 0 - 40 mg/dL   LDL Cholesterol 75 0 - 99 mg/dL    Comment:        Total Cholesterol/HDL:CHD Risk Coronary Heart Disease Risk Table                     Men   Women  1/2 Average Risk   3.4   3.3  Average Risk       5.0   4.4  2 X Average Risk   9.6   7.1  3 X Average Risk  23.4   11.0        Use the calculated Patient Ratio above and the CHD Risk Table to determine the patient's CHD Risk.        ATP III CLASSIFICATION (LDL):  <100     mg/dL   Optimal  100-129  mg/dL   Near or Above                    Optimal  130-159  mg/dL   Borderline  160-189  mg/dL   High  >190     mg/dL   Very High   D-dimer, quantitative (not at [facility])     Status: None   Collection Time: 08/01/17  9:09 AM  Result Value Ref Range   D-Dimer, Quant <0.27 0.00 - 0.50 ug/mL-FEU    Comment: (NOTE) At the manufacturer cut-off of 0.50 ug/mL FEU, this assay has been documented to exclude PE with a sensitivity and negative predictive value of 97 to 99%.  At this time, this assay has not been approved by the FDA to exclude DVT/VTE. Results should be correlated with clinical presentation.   Magnesium     Status: None   Collection Time: 08/01/17  9:09 AM  Result Value Ref Range   Magnesium 2.1 1.7 - 2.4 mg/dL     Lipid Panel     Component Value Date/Time   CHOL 138 08/01/2017 0629   TRIG 125 08/01/2017 0629   HDL 38 (L) 08/01/2017 0629   CHOLHDL 3.6 08/01/2017 0629   VLDL 25 08/01/2017  0629   LDLCALC 75 08/01/2017 0629       HPI :  76 y.o. male with a hx of  Coronary artery disease, with drug-eluting stent to the right coronary artery in November 2009, other history includes hypertension, hyperlipidemia, who is being seen today for the evaluation of recurrent chest pain .apparently had chest discomfort on and off all day after mowing the grass using a riding mower. He also had complaints of indigestion which is not normal for him to have. On arrival to the emergency room he was found to be hypertensive with a blood pressure 188/92, heart rate 77, O2 sat 98% he was afebrile. Pertinent labs include a potassium of 3.6 (should be 4.0 in cardiac patients), he is not found to be anemic. Troponin negative 3. EKG revealed normal sinus rhythm with right bundle-branch block and left anterior fascicular block, heart of 64 bpm, (unchanged from prior EKGs).. Chest x-ray was negative for CHF or pneumonia. History with aspirin and sublingual nitroglycerin. Admitted to rule out ACS.  HOSPITAL COURSE:   Chest pain Cardiac enzymes negative with atypical features  D-dimer.negative  Describes pain as being heartburn, started on PPI Evaluated by cardiology, atypical symptoms for ischemic heart disease, nor are they reminiscent of his prior anginal symptoms. Possibly related to  GERD given that they were brought on by drinking a pot of coffee and a breakfast sandwich and accompanied by belching. Did not feel an inpatient stress test is warranted at this time. He can follow up with his cardiologist, Dr. Domenic Polite, as scheduled in September 2018. If symptoms recur/persist, a stress test may be considered at that time. He can be discharged from cards  standpoint.   CAD: Hx of stent to RCA in 2008. Continue metoprolol, lisinopril, statin therapy. He remains on aspirin and Plavix.. . Schedule for nuclear stress test  Hypertension: Blood pressure elevated in the ED. Review of home medications include   lisinopril/ HCTZ ,20/12.5 mg. May need to increase for better control  , could consider adding Norvasc   Hypercholesterolemia: Remains on statin therapy. Would recommend stricter control of low-cholesterol diet based upon the food he states he's been eating at home. LDL 75  Discharge Exam: *  Blood pressure (!) 161/77, pulse 66, temperature 97.9 F (36.6 C), temperature source Oral, resp. rate 18, height '5\' 10"'  (1.778 m), weight 77.6 kg (171 lb 1.2 oz), SpO2 97 %.  Cardiac:  normal S1, S2; RRR; no murmur  Lungs:  clear to auscultation bilaterally, no wheezing, rhonchi or rales  Abd: soft, nontender, no hepatomegaly  Ext: no edema Musculoskeletal:  No deformities, BUE and BLE strength normal and equal Skin: warm and dry  Neuro:  CNs 2-12 intact, no focal abnormalities noted Psych:  Normal affect     Follow-up Information    Caryl Bis, MD. Call.   Specialty:  Family Medicine Why:  Hospital follow-up in 3-5 days Contact information: [place] [address] [phone]           Signed: Reyne Dumas 08/01/2017, 1:12 PM        Time spent >1 hour

## 2017-08-01 NOTE — Care Management Obs Status (Signed)
[place] NOTIFICATION   Patient Details  Name: David Harmon MRN: [id number] Date of Birth: 1941-10-29   Medicare Observation Status Notification Given:  Other (see comment) (discharged <24hrs)    Sherald Barge, RN 08/01/2017, 1:16 PM

## 2017-08-01 NOTE — Consult Note (Addendum)
Cardiology Consultation:   Patient ID: David Harmon; [id number]; 05/06/1941   Admit date: 07/31/2017 Date of Consult: 08/01/2017  Primary Care Provider: Caryl Bis, MD Primary Cardiologist: Domenic Polite     Patient Profile:   David Harmon is a 76 y.o. male with a hx of  Coronary artery disease, with drug-eluting stent to the right coronary artery in November 2009, other history includes hypertension, hyperlipidemia, who is being seen today for the evaluation of recurrent chest pain at the request of Dr. Allyson Sabal, Hospitalist.   History of Present Illness:   Mr. Wambold presented to the ER with complaints of "a little chest pain" with known history of CAD, and encouraged to be seen by family members. The patient apparently had chest discomfort on and off all day after mowing the grass using a riding mower. He also had complaints of indigestion which is not normal for him to have. He did take nitroglycerin home.  He states he was in his usual state of health, got up early in the morning and drank hot coffee, ran some errands, stopped and ate a bacon egg and cheese sandwich, came home and mode his lawn using a riding more for about 4 hours. Throughout the time he mowing, he continued to have a lot of burping and chest discomfort on and off which he describes as "aggravating" without associated dyspnea, diaphoresis, nausea, or weakness. He also states that he had a spicy meal the night before, tacos with all of the topics. The patient states that his wife told him to take an antiacid which he states he does not take nor is he on a PPI. Because of the ongoing burping and discomfort he chose to come to the emergency room. He denies medical noncompliance. Pain is not similar to the pain he had prior to stent placement.   On arrival to the emergency room he was found to be hypertensive with a blood pressure 188/92, heart rate 77, O2 sat 98% he was afebrile. Pertinent labs include a potassium of  3.6 (should be 4.0 in cardiac patients), he is not found to be anemic. Troponin negative 3. EKG revealed normal sinus rhythm with right bundle-branch block and left anterior fascicular block, heart of 64 bpm, (unchanged from prior EKGs).. Chest x-ray was negative for CHF or pneumonia. History with aspirin and sublingual nitroglycerin. Admitted to rule out ACS.  He is currently not complaining of any burping, but when asked about  pain "I know it's there."  Past Medical History:  Diagnosis Date  . Arthritis   . Coronary atherosclerosis of native coronary artery    DES RCA 11/09 (Dr. Terrence Dupont)  . Essential hypertension, benign   . History of kidney stones   . Mixed hyperlipidemia   . Myocardial infarction [facility])    NSTEMI 11/09    Past Surgical History:  Procedure Laterality Date  . CARDIAC CATHETERIZATION     with stent  . CATARACT EXTRACTION W/PHACO Right 07/28/2017   Procedure: CATARACT EXTRACTION PHACO AND INTRAOCULAR LENS PLACEMENT RIGHT EYE;  Surgeon: Tonny Branch, MD;  Location: [facility];  Service: Ophthalmology;  Laterality: Right;  CDE: 8.33  . EXPLORATORY LAPAROTOMY  2007   Small bowel obstruction  . Resection of pilonidal cyst        Inpatient Medications: Scheduled Meds: . aspirin EC  81 mg Oral Daily  . atorvastatin  40 mg Oral Daily  . clopidogrel  75 mg Oral Daily  . enoxaparin (LOVENOX) injection  40 mg  Subcutaneous Q24H  . lisinopril  20 mg Oral Daily   And  . hydrochlorothiazide  12.5 mg Oral Daily  . metoprolol succinate  25 mg Oral Daily  . multivitamin with minerals   Oral Daily   Continuous Infusions:  PRN Meds: acetaminophen, gi cocktail, hydrALAZINE, morphine injection, ondansetron (ZOFRAN) IV  Allergies:   No Known Allergies  Social History:   Social History   Social History  . Marital status: Married    Spouse name: N/A  . Number of children: N/A  . Years of education: N/A   Occupational History  . Truck driver    Social History Main  Topics  . Smoking status: Former Smoker    Packs/day: 0.50    Years: 10.00    Types: Cigarettes    Quit date: 12/16/1965  . Smokeless tobacco: Never Used     Comment: Has not smoked since 41 yrs.  . Alcohol use No  . Drug use: No  . Sexual activity: Yes    Birth control/ protection: None   Other Topics Concern  . Not on file   Social History Narrative  . No narrative on file    Family History:    Family History  Problem Relation Age of Onset  . Coronary artery disease Father   . Coronary artery disease Mother   . Diabetes type II Sister      ROS:  Please see the history of present illness.  ROS  All other ROS reviewed and negative.     Physical Exam/Data:   Vitals:   07/31/17 2230 08/01/17 0000 08/01/17 0049 08/01/17 0614  BP: (!) 158/89 (!) 155/90 (!) 166/81 (!) 161/77  Pulse: 60 (!) 58 (!) 59 66  Resp: 13 12  18   Temp:   97.8 F (36.6 C) 97.9 F (36.6 C)  TempSrc:   Oral Oral  SpO2: 99% 96% 100% 97%  Weight:   171 lb 1.2 oz (77.6 kg)   Height:   5\' 10"  (1.778 m)    No intake or output data in the 24 hours ending 08/01/17 0821 Filed Weights   07/31/17 1914 08/01/17 0049  Weight: 180 lb (81.6 kg) 171 lb 1.2 oz (77.6 kg)   Body mass index is 24.55 kg/m.  General:  Well nourished, well developed, in no acute distress HEENT: normal Lymph: no adenopathy Neck: no JVD Endocrine:  No thryomegaly Vascular: No carotid bruits; FA pulses 2+ bilaterally without bruits  Cardiac:  normal S1, S2; RRR; no murmur  Lungs:  clear to auscultation bilaterally, no wheezing, rhonchi or rales  Abd: soft, nontender, no hepatomegaly  Ext: no edema Musculoskeletal:  No deformities, BUE and BLE strength normal and equal Skin: warm and dry  Neuro:  CNs 2-12 intact, no focal abnormalities noted Psych:  Normal affect   EKG:  The EKG was personally reviewed and demonstrates:  Normal sinus rhythm with right bundle-branch block, left anterior fascicular block, heart rate 64  bpm. Telemetry:  Telemetry was personally reviewed and demonstrates:  Normal sinus rhythm with right bundle-branch block.  Relevant CV Studies: NM Stress Myoview 08/29/2017 Study Result    There was no ST segment deviation noted during stress.  Findings consistent with prior inferior myocardial infarction without peri-infarct ischemia.  This is a low risk study. There is no significant myocardium currently at jeopardy.  The left ventricular ejection fraction is normal (55-65%).       Laboratory Data:  Chemistry Recent Labs Lab 07/31/17 1917  NA 139  K 3.6  CL 105  CO2 26  GLUCOSE 103*  BUN 20  CREATININE 0.94  CALCIUM 9.4  GFRNONAA >60  GFRAA >60  ANIONGAP 8    Hematology Recent Labs Lab 07/31/17 1917  WBC 7.0  RBC 4.69  HGB 14.3  HCT 42.2  MCV 90.0  MCH 30.5  MCHC 33.9  RDW 13.0  PLT 247   Cardiac Enzymes Recent Labs Lab 07/31/17 1917 08/01/17 0121 08/01/17 0629  TROPONINI <0.03 <0.03 <0.03   No results for input(s): TROPIPOC in the last 168 hours.  BNPNo results for input(s): BNP, PROBNP in the last 168 hours.  DDimer No results for input(s): DDIMER in the last 168 hours.  Radiology/Studies:  Dg Chest 2 View  Result Date: 07/31/2017 CLINICAL DATA:  Intermittent left-sided chest pain today. EXAM: CHEST  2 VIEW COMPARISON:  Radiograph 03/24/2010 FINDINGS: Atherosclerosis of the aortic arch. The cardiomediastinal contours are normal. The lungs are clear. Pulmonary vasculature is normal. No consolidation, pleural effusion, or pneumothorax. No acute osseous abnormalities are seen. IMPRESSION: 1. No acute abnormality. 2. Atherosclerosis of the thoracic aorta. Electronically Signed   By: Jeb Levering M.D.   On: 07/31/2017 19:35    Assessment and Plan:   1. Recurrent chest pain: Pain is typical and atypical in etiology with symptoms of burping and discomfort associated with this lasting all day nonradiating, not associated with diaphoresis dizziness  or dyspnea. The patient did eat a lot of acid producing food and drank coffee. He is not on antiacids. Will begin PPI. Check magnesium as he is had low normal potassium but needs to have a level of 4.0 with CAD.   2. CAD: Hx of stent to RCA in 2008. He remains on metoprolol, lisinopril, statin therapy. He remains on Plavix.. Plan stress Will hold BB this am prior to test. I've discussed need for stress test for diagnostic prognostic purposes. He is willing to proceed. Keep him nothing by mouth.  3. Hypertension: Her pressure was elevated when he first came in. Remains elevated this morning. Review of home medications potassium on lisinopril/ HCTZ ,20/12.5 mg. May need to increase for better control in cardiac patient to keep blood pressure less than 130/70.  4.Hypercholesterolemia: Remains on statin therapy. Would recommend stricter control of low-cholesterol diet based upon the food he states he's been eating at home.   Signed, Jory Sims DNP, ANP, AACC 08/01/2017 8:21 AM   The patient was seen and examined, and I agree with the history, physical exam, assessment and plan as documented above, with modifications as noted below. I have also personally reviewed all relevant documentation, old records, labs, and both radiographic and cardiovascular studies. I have also independently interpreted old and new ECG's.  76 yr old male with RCA stent in 10/2008 admitted with left-sided chest discomfort. Underwent a nuclear stress test in 08/2016 as part of normal DOT requirements which showed inferior infarct without peri-infarct ischemia.  He drank a pot of coffee yesterday morning followed by a breakfast sandwich and began feeling symptoms which lasted throughout the day. Says they are neither sharp nor dull. Not accompanied by shortness of breath. He remains very active and has had no new symptoms of fatigue in the last year. Troponins normal. D-dimer is normal. Chest xray is unremarkable. ECG  shows RBBB and LAFB. He is hypertensive but says his BP is checked frequently and it is usually normal.  Recommendations: I feel these are atypical symptoms for ischemic heart disease, nor are they reminiscent of  his prior anginal symptoms. Possibly related to GERD given that they were brought on by drinking a pot of coffee and a breakfast sandwich and accompanied by belching. Agree with PPI.  I do not feel an inpatient stress test is warranted at this time. He can follow up with his cardiologist, Dr. Domenic Polite, as scheduled in September 2018. If symptoms recur/persist, a stress test may be considered at that time. He can be discharged from my standpoint.   Kate Sable, MD, [facility]  08/01/2017 10:18 AM

## 2017-08-21 ENCOUNTER — Other Ambulatory Visit: Payer: Self-pay | Admitting: Cardiology

## 2017-08-31 DIAGNOSIS — I2510 Atherosclerotic heart disease of native coronary artery without angina pectoris: Secondary | ICD-10-CM | POA: Diagnosis not present

## 2017-09-15 ENCOUNTER — Ambulatory Visit (INDEPENDENT_AMBULATORY_CARE_PROVIDER_SITE_OTHER): Payer: Medicare Other | Admitting: Cardiology

## 2017-09-15 ENCOUNTER — Encounter: Payer: Self-pay | Admitting: Cardiology

## 2017-09-15 VITALS — BP 132/70 | HR 61 | Ht 70.0 in | Wt 181.0 lb

## 2017-09-15 DIAGNOSIS — E782 Mixed hyperlipidemia: Secondary | ICD-10-CM | POA: Diagnosis not present

## 2017-09-15 DIAGNOSIS — I10 Essential (primary) hypertension: Secondary | ICD-10-CM | POA: Diagnosis not present

## 2017-09-15 DIAGNOSIS — I2510 Atherosclerotic heart disease of native coronary artery without angina pectoris: Secondary | ICD-10-CM | POA: Diagnosis not present

## 2017-09-15 DIAGNOSIS — K219 Gastro-esophageal reflux disease without esophagitis: Secondary | ICD-10-CM | POA: Diagnosis not present

## 2017-09-15 NOTE — Progress Notes (Signed)
Cardiology Office Note  Date: 09/15/2017   ID: David Harmon, DOB 1941/03/08, MRN [id number]  PCP: Caryl Bis, MD  Primary Cardiologist: Rozann Lesches, MD   Chief Complaint  Patient presents with  . Coronary Artery Disease    History of Present Illness: David Harmon is a 76 y.o. male last seen in September 2017. Reviewed interval records. He was recently hospitalized with chest pain and ruled out for ACS.Marland Kitchen He was seen in consultation by Dr. Bronson Ing who felt that the symptoms were atypical for ischemic heart disease and recommended continuing medical therapy with outpatient follow-up. No additional ischemic testing was arranged.  He presents today for follow-up. He has had no further symptoms, feels well. 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  He did undergo a Myoview study in September 2017 which was low risk, showing evidence of inferior scar but no ischemic territories and normal LVEF.  We reviewed his medications which are outlined below. He has decided to use the Protonix only as needed at this point since he does not have regular indigestion symptoms.  Past Medical History:  Diagnosis Date  . Arthritis   . Coronary atherosclerosis of native coronary artery    DES RCA 11/09 (Dr. Terrence Dupont)  . Essential hypertension, benign   . History of kidney stones   . Mixed hyperlipidemia   . Myocardial infarction [facility])    NSTEMI 11/09    Past Surgical History:  Procedure Laterality Date  . CARDIAC CATHETERIZATION     with stent  . CATARACT EXTRACTION W/PHACO Right 07/28/2017   Procedure: CATARACT EXTRACTION PHACO AND INTRAOCULAR LENS PLACEMENT RIGHT EYE;  Surgeon: Tonny Branch, MD;  Location: [facility];  Service: Ophthalmology;  Laterality: Right;  CDE: 8.33  . EXPLORATORY LAPAROTOMY  2007   Small bowel obstruction  . Resection of pilonidal cyst        Current Outpatient Prescriptions  Medication Sig Dispense Refill  . acetaminophen (TYLENOL) 500 MG tablet Take 1,000 mg by mouth every 6 (six) hours as needed for mild pain or moderate pain.    Marland Kitchen aspirin EC 81 MG tablet Take 81 mg by mouth daily.    Marland Kitchen atorvastatin (LIPITOR) 40 MG tablet Take 40 mg by mouth daily.      . clopidogrel (PLAVIX) 75 MG tablet take 1 tablet by mouth once daily 90 tablet 1  . lisinopril-hydrochlorothiazide (PRINZIDE,ZESTORETIC) 20-12.5 MG per tablet Take 1 tablet by mouth daily.     . metoprolol succinate (TOPROL-XL) 25 MG 24 hr tablet take 1 tablet by mouth once daily 90 tablet 3  . Multiple Vitamins-Minerals (DAILY MENS HEALTH FORMULA PO) Take 1 tablet by mouth daily.     . pantoprazole (PROTONIX) 40 MG tablet Take 1 tablet (40 mg total) by mouth daily. (Patient taking differently: Take 40 mg by mouth daily as needed. ) 30 tablet 1   No current facility-administered medications for this visit.    Allergies:  Patient has no known allergies.   Social History: The patient  reports that he quit smoking about 51 years ago. His smoking use included Cigarettes. He has a 5.00 pack-year smoking history. He has never used smokeless tobacco. He reports that he does not drink alcohol or use drugs.   ROS:  Please see the history of present illness. Otherwise, complete review of systems is positive for none.  All other systems are reviewed and negative.   Physical Exam: VS:  BP 132/70   Pulse 61   Ht 5\' 10"  (1.778 m)   Wt 181 lb (82.1 kg)   SpO2 98%   BMI 25.97 kg/m , BMI Body mass index is 25.97 kg/m.  Wt Readings from Last 3 Encounters:  09/15/17 181 lb (82.1 kg)  08/01/17 171 lb 1.2 oz (77.6 kg)  07/23/17 179 lb (81.2 kg)    General: Patient appears comfortable at rest. HEENT: Conjunctiva and lids normal, oropharynx clear. Neck: Supple, no elevated JVP or carotid bruits, no thyromegaly. Lungs: Clear to auscultation, nonlabored breathing at rest. Cardiac:  Regular rate and rhythm, no S3 or significant systolic murmur, no pericardial rub. Abdomen: Soft, nontender, bowel sounds present, no guarding or rebound. Extremities: No pitting edema, distal pulses 2+. Skin: Warm and dry. Musculoskeletal: No kyphosis. Neuropsychiatric: Alert and oriented x3, affect grossly appropriate.  ECG: I personally reviewed the tracing from 07/31/2017 which showed sinus rhythm with right bundle branch block and left anterior fascicular block.  Recent Labwork: 07/31/2017: BUN 20; Creatinine, Ser 0.94; Hemoglobin 14.3; Platelets 247; Potassium 3.6; Sodium 139; TSH 5.167 08/01/2017: Magnesium 2.1     Component Value Date/Time   CHOL 138 08/01/2017 0629   TRIG 125 08/01/2017 0629   HDL 38 (L) 08/01/2017 0629   CHOLHDL 3.6 08/01/2017 0629   VLDL 25 08/01/2017 0629   LDLCALC 75 08/01/2017 0629    Other Studies Reviewed Today:  Carlton Adam Myoview 08/29/2016:  There was no ST segment deviation noted during stress.  Findings consistent with prior inferior myocardial infarction without peri-infarct ischemia.  This is a low risk study. There is no significant myocardium currently at jeopardy.  The left ventricular ejection fraction is normal (55-65%).  Assessment and Plan:  1. CAD status post DES to the RCA in 2009. He underwent a low risk Myoview last year and does not report any clear angina symptoms on medical therapy. We will continue with observation for now.  2. Intermittent reflux symptoms, to use Protonix as needed at this point.  3. Essential hypertension, blood pressure control is adequate on Prinzide and Toprol-XL.  4. Hyperlipidemia, continues on Lipitor with recent LDL 75.  Current medicines were reviewed with the patient today.  Disposition: Follow-up in one year, sooner if needed.  Signed, Satira Sark, MD, [facility] 09/15/2017 2:40 PM    [place] [facility] [address] [place], [place] [address] Phone: [phone]; Fax: [phone]

## 2017-09-15 NOTE — Patient Instructions (Signed)
Your physician wants you to follow-up in: 1 year with Dr.McDowell You will receive a reminder letter in the mail two months in advance. If you don't receive a letter, please call our office to schedule the follow-up appointment.    Your physician recommends that you continue on your current medications as directed. Please refer to the Current Medication list given to you today.    If you need a refill on your cardiac medications before your next appointment, please call your pharmacy.     No lab work or tests ordered today.      Thank you for choosing [facility] !        

## 2017-10-08 DIAGNOSIS — Z23 Encounter for immunization: Secondary | ICD-10-CM | POA: Diagnosis not present

## 2017-12-20 DIAGNOSIS — M7551 Bursitis of right shoulder: Secondary | ICD-10-CM | POA: Diagnosis not present

## 2017-12-25 DIAGNOSIS — I2510 Atherosclerotic heart disease of native coronary artery without angina pectoris: Secondary | ICD-10-CM | POA: Diagnosis not present

## 2017-12-25 DIAGNOSIS — R5383 Other fatigue: Secondary | ICD-10-CM | POA: Diagnosis not present

## 2017-12-25 DIAGNOSIS — I10 Essential (primary) hypertension: Secondary | ICD-10-CM | POA: Diagnosis not present

## 2017-12-25 DIAGNOSIS — E782 Mixed hyperlipidemia: Secondary | ICD-10-CM | POA: Diagnosis not present

## 2018-01-29 DIAGNOSIS — M7551 Bursitis of right shoulder: Secondary | ICD-10-CM | POA: Diagnosis not present

## 2018-01-29 DIAGNOSIS — M7552 Bursitis of left shoulder: Secondary | ICD-10-CM | POA: Diagnosis not present

## 2018-02-19 ENCOUNTER — Other Ambulatory Visit: Payer: Self-pay | Admitting: *Deleted

## 2018-02-19 MED ORDER — CLOPIDOGREL BISULFATE 75 MG PO TABS: 75.0000 mg | ORAL_TABLET | Freq: Every day | ORAL | 3 refills | Status: DC

## 2018-03-07 DIAGNOSIS — J019 Acute sinusitis, unspecified: Secondary | ICD-10-CM | POA: Diagnosis not present

## 2018-05-29 ENCOUNTER — Other Ambulatory Visit: Payer: Self-pay

## 2018-05-29 ENCOUNTER — Other Ambulatory Visit: Payer: Self-pay | Admitting: *Deleted

## 2018-05-29 MED ORDER — METOPROLOL SUCCINATE ER 25 MG PO TB24: 25.0000 mg | ORAL_TABLET | Freq: Every day | ORAL | 3 refills | Status: DC

## 2018-05-29 NOTE — Telephone Encounter (Signed)
refilled metoprolol per fax request 

## 2018-07-07 DIAGNOSIS — H1045 Other chronic allergic conjunctivitis: Secondary | ICD-10-CM | POA: Diagnosis not present

## 2018-07-09 DIAGNOSIS — Q446 Cystic disease of liver: Secondary | ICD-10-CM | POA: Diagnosis not present

## 2018-07-09 DIAGNOSIS — K219 Gastro-esophageal reflux disease without esophagitis: Secondary | ICD-10-CM | POA: Diagnosis not present

## 2018-07-09 DIAGNOSIS — E782 Mixed hyperlipidemia: Secondary | ICD-10-CM | POA: Diagnosis not present

## 2018-07-09 DIAGNOSIS — Z9189 Other specified personal risk factors, not elsewhere classified: Secondary | ICD-10-CM | POA: Diagnosis not present

## 2018-07-09 DIAGNOSIS — I10 Essential (primary) hypertension: Secondary | ICD-10-CM | POA: Diagnosis not present

## 2018-07-09 DIAGNOSIS — R5383 Other fatigue: Secondary | ICD-10-CM | POA: Diagnosis not present

## 2018-07-16 DIAGNOSIS — Z1212 Encounter for screening for malignant neoplasm of rectum: Secondary | ICD-10-CM | POA: Diagnosis not present

## 2018-07-16 DIAGNOSIS — I10 Essential (primary) hypertension: Secondary | ICD-10-CM | POA: Diagnosis not present

## 2018-07-16 DIAGNOSIS — M7552 Bursitis of left shoulder: Secondary | ICD-10-CM | POA: Diagnosis not present

## 2018-07-16 DIAGNOSIS — E782 Mixed hyperlipidemia: Secondary | ICD-10-CM | POA: Diagnosis not present

## 2018-07-16 DIAGNOSIS — Z0001 Encounter for general adult medical examination with abnormal findings: Secondary | ICD-10-CM | POA: Diagnosis not present

## 2018-07-16 DIAGNOSIS — Z23 Encounter for immunization: Secondary | ICD-10-CM | POA: Diagnosis not present

## 2018-07-16 DIAGNOSIS — I2510 Atherosclerotic heart disease of native coronary artery without angina pectoris: Secondary | ICD-10-CM | POA: Diagnosis not present

## 2018-07-16 DIAGNOSIS — K219 Gastro-esophageal reflux disease without esophagitis: Secondary | ICD-10-CM | POA: Diagnosis not present

## 2018-08-27 DIAGNOSIS — I10 Essential (primary) hypertension: Secondary | ICD-10-CM | POA: Diagnosis not present

## 2018-08-27 DIAGNOSIS — L570 Actinic keratosis: Secondary | ICD-10-CM | POA: Diagnosis not present

## 2018-09-07 DIAGNOSIS — M25562 Pain in left knee: Secondary | ICD-10-CM | POA: Diagnosis not present

## 2018-09-08 IMAGING — DX DG CHEST 2V
2 series · 2 of 2 positions shown · non-contrast
Comparison: Radiograph 03/24/2010

CLINICAL DATA: Intermittent left-sided chest pain today.

EXAM:
CHEST  2 VIEW

[chest pa]
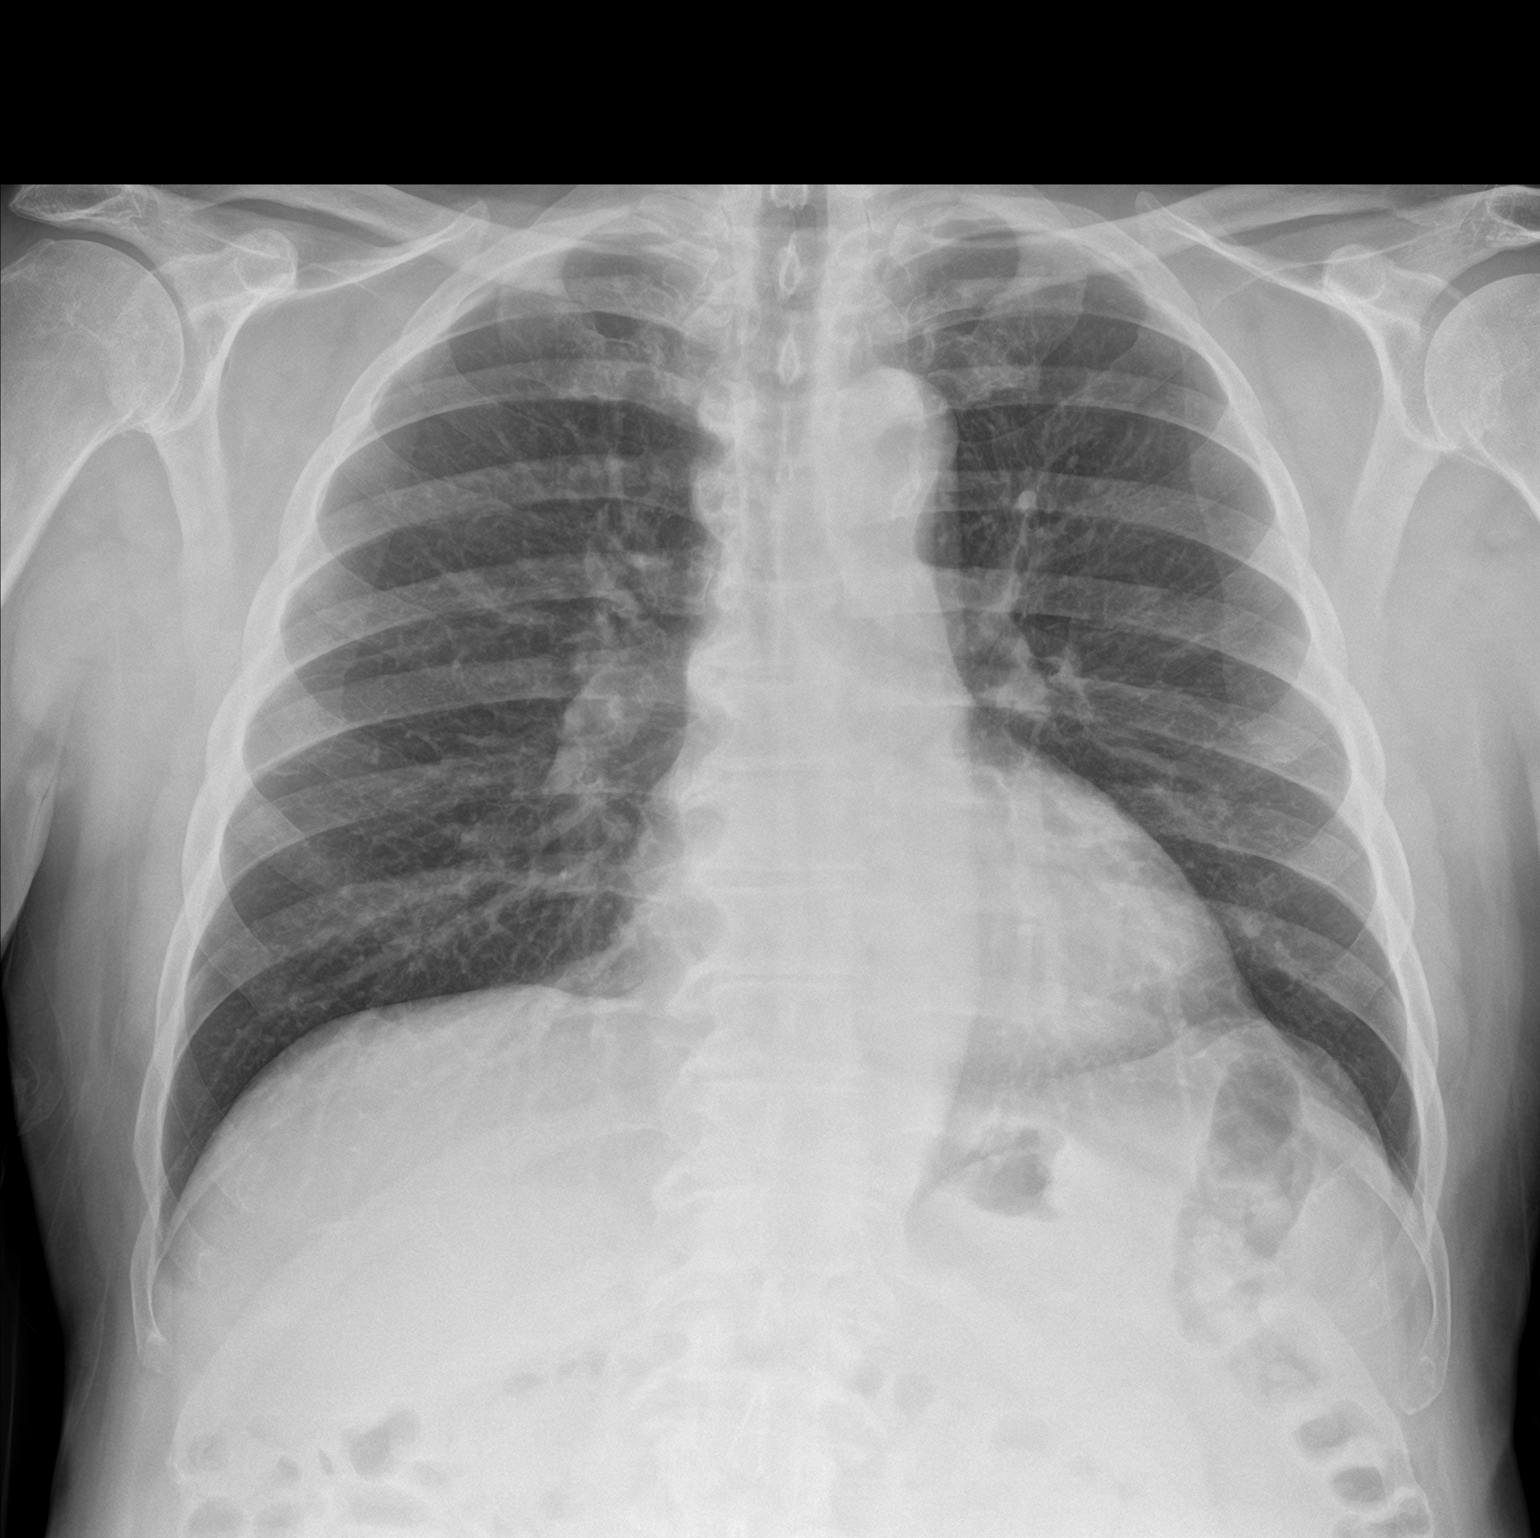

[chest lat]
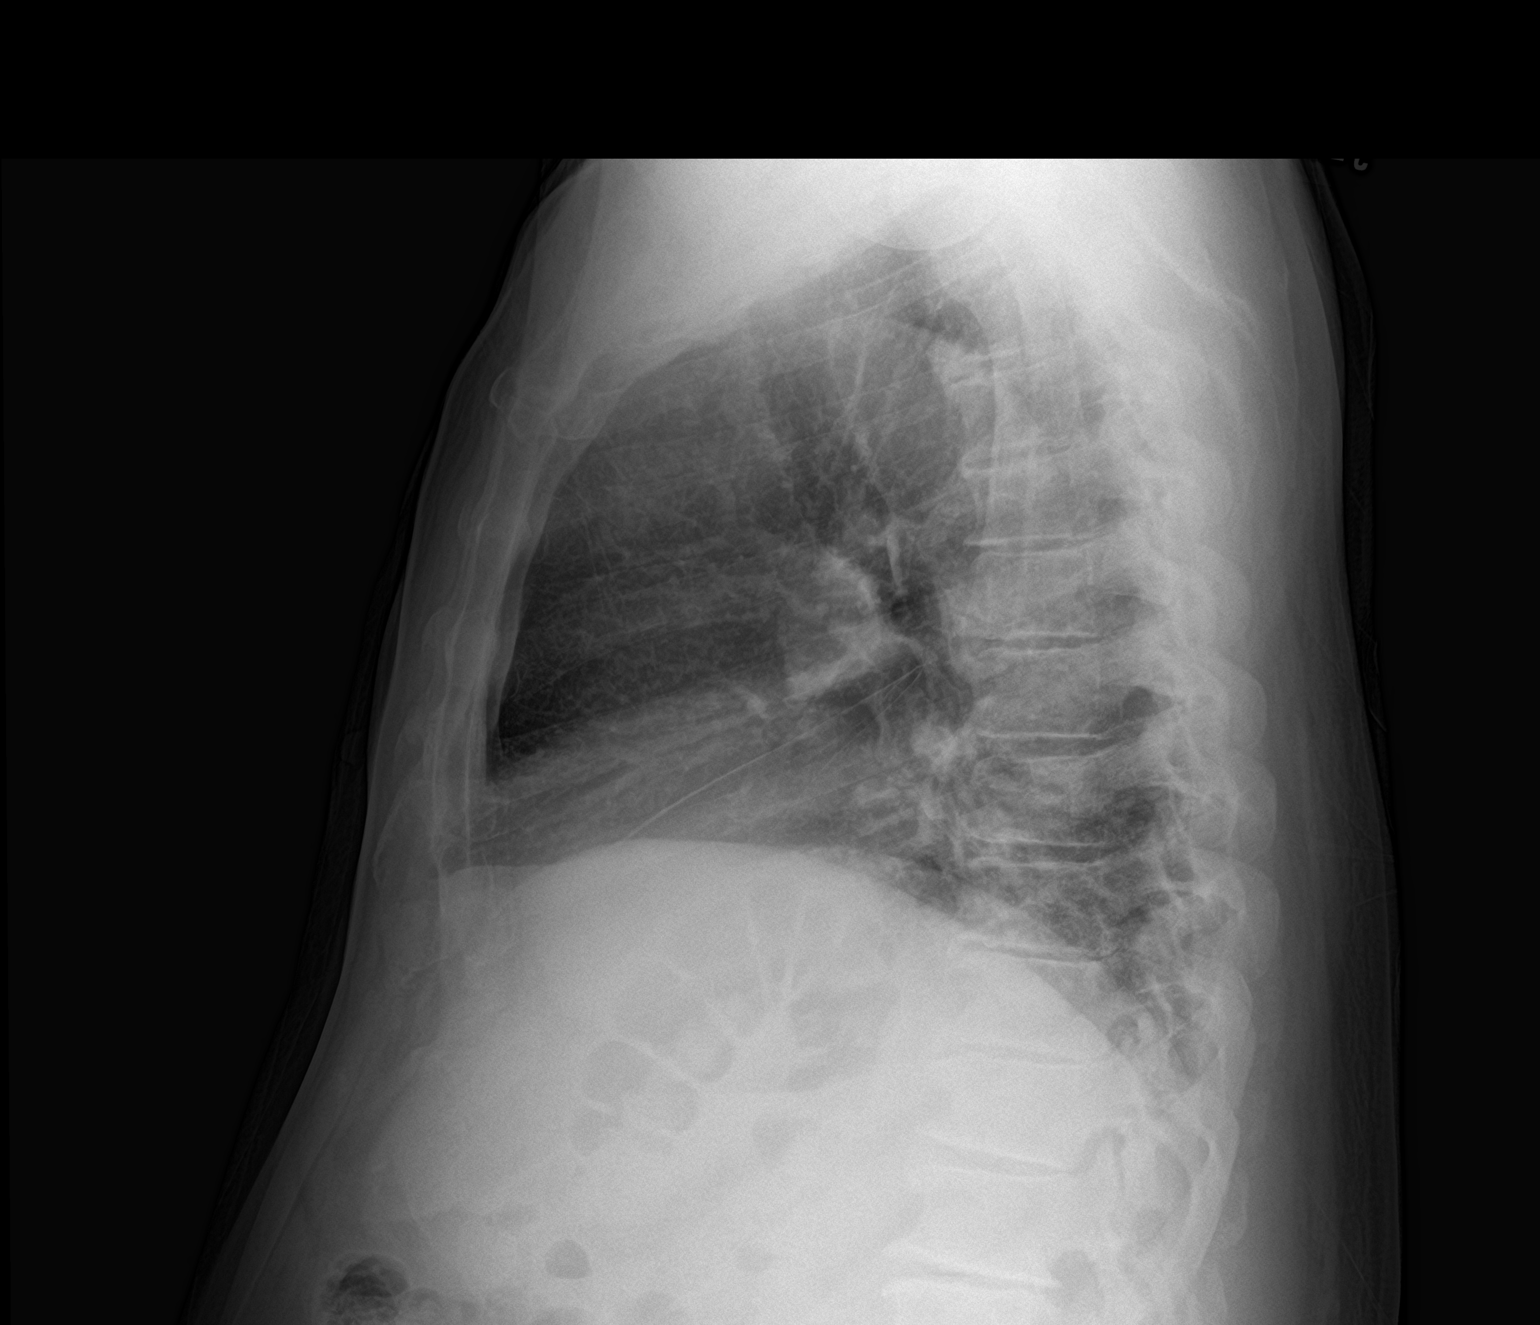

[2 of 2 positions shown; findings below may reference images not displayed]

FINDINGS: Atherosclerosis of the aortic arch. The cardiomediastinal contours
are normal. The lungs are clear. Pulmonary vasculature is normal. No
consolidation, pleural effusion, or pneumothorax. No acute osseous
abnormalities are seen.
IMPRESSION: 1. No acute abnormality.
2. Atherosclerosis of the thoracic aorta.

## 2018-09-16 NOTE — Progress Notes (Signed)
Cardiology Office Note  Date: 09/17/2018   ID: David Harmon, DOB 1941/11/16, MRN [id number]  PCP: Caryl Bis, MD  Primary Cardiologist: Rozann Lesches, MD   Chief Complaint  Patient presents with  . Coronary Artery Disease    History of Present Illness: David Harmon is a 77 y.o. adult last seen in October 2018.  He presents for a routine visit.  Still doing very well, drives a milk delivery truck full-time.  He does not report any angina symptoms or nitroglycerin use, no unusual shortness of breath.  This includes loading and unloading his truck which he does himself.  Reviewed his medications.  Cardiac regimen includes aspirin, Plavix, Lipitor, Toprol-XL, and Prinzide.  I personally reviewed his ECG today which shows sinus rhythm with incomplete right bundle branch block and increased voltage.  Axis scan Myoview from 2017 was low risk as outlined below.  Past Medical History:  Diagnosis Date  . Arthritis   . Coronary atherosclerosis of native coronary artery    DES RCA 11/09 (Dr. Terrence Dupont)  . Essential hypertension, benign   . History of kidney stones   . Mixed hyperlipidemia   . Myocardial infarction [facility])    NSTEMI 11/09    Past Surgical History:  Procedure Laterality Date  . CARDIAC CATHETERIZATION     with stent  . CATARACT EXTRACTION W/PHACO Right 07/28/2017   Procedure: CATARACT EXTRACTION PHACO AND INTRAOCULAR LENS PLACEMENT RIGHT EYE;  Surgeon: Tonny Branch, MD;  Location: [facility];  Service: Ophthalmology;  Laterality: Right;  CDE: 8.33  . EXPLORATORY LAPAROTOMY  2007   Small bowel obstruction  . Resection of pilonidal cyst      Current Outpatient Medications  Medication Sig Dispense Refill  . acetaminophen (TYLENOL) 500 MG tablet Take 1,000 mg by mouth every 6 (six) hours as needed for mild pain or moderate pain.    Marland Kitchen aspirin EC 81 MG tablet Take 81 mg by mouth daily.    Marland Kitchen atorvastatin (LIPITOR) 40 MG tablet Take 40 mg by mouth daily.      .  clopidogrel (PLAVIX) 75 MG tablet Take 1 tablet (75 mg total) by mouth daily. 90 tablet 3  . lisinopril-hydrochlorothiazide (PRINZIDE,ZESTORETIC) 20-12.5 MG per tablet Take 1 tablet by mouth daily.     . metoprolol succinate (TOPROL-XL) 25 MG 24 hr tablet Take 1 tablet (25 mg total) by mouth daily. 90 tablet 3  . Multiple Vitamins-Minerals (DAILY MENS HEALTH FORMULA PO) Take 1 tablet by mouth daily.     . pantoprazole (PROTONIX) 40 MG tablet Take 1 tablet (40 mg total) by mouth daily. (Patient taking differently: Take 40 mg by mouth daily as needed. ) 30 tablet 1   No current facility-administered medications for this visit.    Allergies:  Patient has no known allergies.   Social History: The patient  reports that he quit smoking about 52 years ago. His smoking use included cigarettes. He has a 5.00 pack-year smoking history. He has never used smokeless tobacco. He reports that he does not drink alcohol or use drugs.   ROS:  Please see the history of present illness. Otherwise, complete review of systems is positive for recent left patellar pain, wearing a brace and status post evaluation by PCP.  All other systems are reviewed and negative.   Physical Exam: VS:  BP (!) 158/98   Pulse 79   Ht 5\' 10"  (1.778 m)   Wt 172 lb (78 kg)   SpO2 98%  BMI 24.68 kg/m , BMI Body mass index is 24.68 kg/m.  Wt Readings from Last 3 Encounters:  09/17/18 172 lb (78 kg)  09/15/17 181 lb (82.1 kg)  08/01/17 171 lb 1.2 oz (77.6 kg)    General: Patient appears comfortable at rest. HEENT: Conjunctiva and lids normal, oropharynx clear. Neck: Supple, no elevated JVP or carotid bruits, no thyromegaly. Lungs: Clear to auscultation, nonlabored breathing at rest. Cardiac: Regular rate and rhythm, no S3 or significant systolic murmur. Abdomen: Soft, nontender, bowel sounds present. Extremities: No pitting edema, distal pulses 2+.  ECG: I personally reviewed the tracing from 07/31/2017 which showed sinus  rhythm with right bundle branch block and left anterior fascicular block.  Recent Labwork: No results found for requested labs within last 8760 hours.     Component Value Date/Time   CHOL 138 08/01/2017 0629   TRIG 125 08/01/2017 0629   HDL 38 (L) 08/01/2017 0629   CHOLHDL 3.6 08/01/2017 0629   VLDL 25 08/01/2017 0629   LDLCALC 75 08/01/2017 0629    Other Studies Reviewed Today:  Carlton Adam Myoview 08/29/2016:  There was no ST segment deviation noted during stress.  Findings consistent with prior inferior myocardial infarction without peri-infarct ischemia.  This is a low risk study. There is no significant myocardium currently at jeopardy.  The left ventricular ejection fraction is normal (55-65%).  Chest x-ray 07/31/2017: FINDINGS: Atherosclerosis of the aortic arch. The cardiomediastinal contours are normal. The lungs are clear. Pulmonary vasculature is normal. No consolidation, pleural effusion, or pneumothorax. No acute osseous abnormalities are seen.  IMPRESSION: 1. No acute abnormality. 2. Atherosclerosis of the thoracic aorta.  Assessment and Plan:  1.  CAD with history of DES to the RCA in 2009.  He continues to do very well, no active angina on medical therapy, still working full-time.  ECG reviewed and stable.  2.  Mixed hyperlipidemia, he remains on Lipitor and had recent lab work with Dr. Quillian Quince.  Requesting results.  Current medicines were reviewed with the patient today.   Orders Placed This Encounter  Procedures  . EKG 12-Lead    Disposition: Follow-up in 1 year, sooner if needed.  Signed, Satira Sark, MD, [facility] 09/17/2018 3:43 PM    [place] at [place], [place], [place] [address] Phone: [phone]; Fax: [phone]

## 2018-09-17 ENCOUNTER — Encounter: Payer: Self-pay | Admitting: Cardiology

## 2018-09-17 ENCOUNTER — Encounter: Payer: Self-pay | Admitting: *Deleted

## 2018-09-17 ENCOUNTER — Ambulatory Visit: Payer: Medicare Other | Admitting: Cardiology

## 2018-09-17 VITALS — BP 158/98 | HR 79 | Ht 70.0 in | Wt 172.0 lb

## 2018-09-17 DIAGNOSIS — E782 Mixed hyperlipidemia: Secondary | ICD-10-CM

## 2018-09-17 DIAGNOSIS — I25119 Atherosclerotic heart disease of native coronary artery with unspecified angina pectoris: Secondary | ICD-10-CM

## 2018-09-17 DIAGNOSIS — Z23 Encounter for immunization: Secondary | ICD-10-CM | POA: Diagnosis not present

## 2018-09-17 NOTE — Patient Instructions (Addendum)
Medication Instructions:   Your physician recommends that you continue on your current medications as directed. Please refer to the Current Medication list given to you today.  Labwork:  NONE-lab work requested from your family doctor.  Testing/Procedures:  NONE  Follow-Up:  Your physician recommends that you schedule a follow-up appointment in: 1 year. You will receive a reminder letter in the mail in about 10 months reminding you to call and schedule your appointment. If you don't receive this letter, please contact our office.  Any Other Special Instructions Will Be Listed Below (If Applicable).  If you need a refill on your cardiac medications before your next appointment, please call your pharmacy.

## 2018-09-28 DIAGNOSIS — M1712 Unilateral primary osteoarthritis, left knee: Secondary | ICD-10-CM | POA: Diagnosis not present

## 2018-09-29 ENCOUNTER — Ambulatory Visit: Payer: Medicare Other | Admitting: Cardiology

## 2018-12-11 DIAGNOSIS — J329 Chronic sinusitis, unspecified: Secondary | ICD-10-CM | POA: Diagnosis not present

## 2019-01-13 DIAGNOSIS — I10 Essential (primary) hypertension: Secondary | ICD-10-CM | POA: Diagnosis not present

## 2019-01-13 DIAGNOSIS — K219 Gastro-esophageal reflux disease without esophagitis: Secondary | ICD-10-CM | POA: Diagnosis not present

## 2019-01-13 DIAGNOSIS — Q446 Cystic disease of liver: Secondary | ICD-10-CM | POA: Diagnosis not present

## 2019-01-13 DIAGNOSIS — R5383 Other fatigue: Secondary | ICD-10-CM | POA: Diagnosis not present

## 2019-01-13 DIAGNOSIS — E782 Mixed hyperlipidemia: Secondary | ICD-10-CM | POA: Diagnosis not present

## 2019-01-21 DIAGNOSIS — N281 Cyst of kidney, acquired: Secondary | ICD-10-CM | POA: Diagnosis not present

## 2019-01-21 DIAGNOSIS — Q446 Cystic disease of liver: Secondary | ICD-10-CM | POA: Diagnosis not present

## 2019-01-21 DIAGNOSIS — M7552 Bursitis of left shoulder: Secondary | ICD-10-CM | POA: Diagnosis not present

## 2019-01-21 DIAGNOSIS — I2510 Atherosclerotic heart disease of native coronary artery without angina pectoris: Secondary | ICD-10-CM | POA: Diagnosis not present

## 2019-01-21 DIAGNOSIS — I10 Essential (primary) hypertension: Secondary | ICD-10-CM | POA: Diagnosis not present

## 2019-02-11 ENCOUNTER — Other Ambulatory Visit: Payer: Self-pay | Admitting: Cardiology

## 2019-05-07 DIAGNOSIS — H1032 Unspecified acute conjunctivitis, left eye: Secondary | ICD-10-CM | POA: Diagnosis not present

## 2019-05-22 ENCOUNTER — Other Ambulatory Visit: Payer: Self-pay | Admitting: Cardiology

## 2019-07-14 DIAGNOSIS — I10 Essential (primary) hypertension: Secondary | ICD-10-CM | POA: Diagnosis not present

## 2019-07-14 DIAGNOSIS — R5383 Other fatigue: Secondary | ICD-10-CM | POA: Diagnosis not present

## 2019-07-14 DIAGNOSIS — K219 Gastro-esophageal reflux disease without esophagitis: Secondary | ICD-10-CM | POA: Diagnosis not present

## 2019-07-14 DIAGNOSIS — Q446 Cystic disease of liver: Secondary | ICD-10-CM | POA: Diagnosis not present

## 2019-07-14 DIAGNOSIS — E782 Mixed hyperlipidemia: Secondary | ICD-10-CM | POA: Diagnosis not present

## 2019-08-16 DIAGNOSIS — E782 Mixed hyperlipidemia: Secondary | ICD-10-CM | POA: Diagnosis not present

## 2019-08-16 DIAGNOSIS — I10 Essential (primary) hypertension: Secondary | ICD-10-CM | POA: Diagnosis not present

## 2019-09-08 ENCOUNTER — Telehealth: Payer: Self-pay | Admitting: Cardiology

## 2019-09-08 NOTE — Telephone Encounter (Signed)
Virtual Visit Pre-Appointment Phone Call  "(Name), I am calling you today to discuss your upcoming appointment. We are currently trying to limit exposure to the virus that causes COVID-19 by seeing patients at home rather than in the office."  1. "What is the BEST phone number to call the day of the visit?" - include this in appointment notes  2. Do you have or have access to (through a family member/friend) a smartphone with video capability that we can use for your visit?" a. If yes - list this number in appt notes as cell (if different from BEST phone #) and list the appointment type as a VIDEO visit in appointment notes b. If no - list the appointment type as a PHONE visit in appointment notes  3. Confirm consent - "In the setting of the current Covid19 crisis, you are scheduled for a (phone or video) visit with your provider on (date) at (time).  Just as we do with many in-office visits, in order for you to participate in this visit, we must obtain consent.  If you'd like, I can send this to your mychart (if signed up) or email for you to review.  Otherwise, I can obtain your verbal consent now.  All virtual visits are billed to your insurance company just like a normal visit would be.  By agreeing to a virtual visit, we'd like you to understand that the technology does not allow for your provider to perform an examination, and thus may limit your provider's ability to fully assess your condition. If your provider identifies any concerns that need to be evaluated in person, we will make arrangements to do so.  Finally, though the technology is pretty good, we cannot assure that it will always work on either your or our end, and in the setting of a video visit, we may have to convert it to a phone-only visit.  In either situation, we cannot ensure that we have a secure connection.  Are you willing to proceed?" STAFF: Did the patient verbally acknowledge consent to telehealth visit? Document  YES/NO here: yes  4. Advise patient to be prepared - "Two hours prior to your appointment, go ahead and check your blood pressure, pulse, oxygen saturation, and your weight (if you have the equipment to check those) and write them all down. When your visit starts, your provider will ask you for this information. If you have an Apple Watch or Kardia device, please plan to have heart rate information ready on the day of your appointment. Please have a pen and paper handy nearby the day of the visit as well."  5. Give patient instructions for MyChart download to smartphone OR Doximity/Doxy.me as below if video visit (depending on what platform provider is using)  6. Inform patient they will receive a phone call 15 minutes prior to their appointment time (may be from unknown caller ID) so they should be prepared to answer    TELEPHONE CALL NOTE  David Harmon has been deemed a candidate for a follow-up tele-health visit to limit community exposure during the Covid-19 pandemic. I spoke with the patient via phone to ensure availability of phone/video source, confirm preferred email & phone number, and discuss instructions and expectations.  I reminded David Harmon to be prepared with any vital sign and/or heart rhythm information that could potentially be obtained via home monitoring, at the time of his visit. I reminded David Harmon to expect a phone call prior to  his visit.  David Harmon 09/08/2019 4:09 PM   INSTRUCTIONS FOR DOWNLOADING THE MYCHART APP TO SMARTPHONE  - The patient must first make sure to have activated MyChart and know their login information - If Apple, go to [employer] and type in MyChart in the search bar and download the app. If Android, ask patient to go to [employer] and type in [place] in the search bar and download the app. The app is free but as with any other app downloads, their phone may require them to verify saved payment information or Apple/Android  password.  - The patient will need to then log into the app with their MyChart username and password, and select [facility] as their healthcare provider to link the account. When it is time for your visit, go to the MyChart app, find appointments, and click Begin Video Visit. Be sure to Select Allow for your device to access the Microphone and Camera for your visit. You will then be connected, and your provider will be with you shortly.  **If they have any issues connecting, or need assistance please contact MyChart service desk [phone])**  **If using a computer, in order to ensure the best quality for their visit they will need to use either of the following Internet Browsers: [employer], or Google Chrome**  IF USING DOXIMITY or DOXY.ME - The patient will receive a link just prior to their visit by text.     FULL LENGTH CONSENT FOR TELE-HEALTH VISIT   I hereby voluntarily request, consent and authorize [facility] and its employed or contracted physicians, physician assistants, nurse practitioners or other licensed health care professionals (the Practitioner), to provide me with telemedicine health care services (the Services") as deemed necessary by the treating Practitioner. I acknowledge and consent to receive the Services by the Practitioner via telemedicine. I understand that the telemedicine visit will involve communicating with the Practitioner through live audiovisual communication technology and the disclosure of certain medical information by electronic transmission. I acknowledge that I have been given the opportunity to request an in-person assessment or other available alternative prior to the telemedicine visit and am voluntarily participating in the telemedicine visit.  I understand that I have the right to withhold or withdraw my consent to the use of telemedicine in the course of my care at any time, without affecting my right to future care or treatment,  and that the Practitioner or I may terminate the telemedicine visit at any time. I understand that I have the right to inspect all information obtained and/or recorded in the course of the telemedicine visit and may receive copies of available information for a reasonable fee.  I understand that some of the potential risks of receiving the Services via telemedicine include:   Delay or interruption in medical evaluation due to technological equipment failure or disruption;  Information transmitted may not be sufficient (e.g. poor resolution of images) to allow for appropriate medical decision making by the Practitioner; and/or   In rare instances, security protocols could fail, causing a breach of personal health information.  Furthermore, I acknowledge that it is my responsibility to provide information about my medical history, conditions and care that is complete and accurate to the best of my ability. I acknowledge that Practitioner's advice, recommendations, and/or decision may be based on factors not within their control, such as incomplete or inaccurate data provided by me or distortions of diagnostic images or specimens that may result from electronic transmissions. I  understand that the practice of medicine is not an exact science and that Practitioner makes no warranties or guarantees regarding treatment outcomes. I acknowledge that I will receive a copy of this consent concurrently upon execution via email to the email address I last provided but may also request a printed copy by calling the office of [facility].    I understand that my insurance will be billed for this visit.   I have read or had this consent read to me.  I understand the contents of this consent, which adequately explains the benefits and risks of the Services being provided via telemedicine.   I have been provided ample opportunity to ask questions regarding this consent and the Services and have had my questions  answered to my satisfaction.  I give my informed consent for the services to be provided through the use of telemedicine in my medical care  By participating in this telemedicine visit I agree to the above.

## 2019-09-15 DIAGNOSIS — I10 Essential (primary) hypertension: Secondary | ICD-10-CM | POA: Diagnosis not present

## 2019-09-15 DIAGNOSIS — E782 Mixed hyperlipidemia: Secondary | ICD-10-CM | POA: Diagnosis not present

## 2019-09-17 ENCOUNTER — Ambulatory Visit: Payer: Medicare Other | Admitting: Cardiology

## 2019-09-17 DIAGNOSIS — I2510 Atherosclerotic heart disease of native coronary artery without angina pectoris: Secondary | ICD-10-CM | POA: Diagnosis not present

## 2019-09-17 DIAGNOSIS — N281 Cyst of kidney, acquired: Secondary | ICD-10-CM | POA: Diagnosis not present

## 2019-09-17 DIAGNOSIS — Z1212 Encounter for screening for malignant neoplasm of rectum: Secondary | ICD-10-CM | POA: Diagnosis not present

## 2019-09-17 DIAGNOSIS — I10 Essential (primary) hypertension: Secondary | ICD-10-CM | POA: Diagnosis not present

## 2019-09-17 DIAGNOSIS — Z23 Encounter for immunization: Secondary | ICD-10-CM | POA: Diagnosis not present

## 2019-09-17 DIAGNOSIS — Q446 Cystic disease of liver: Secondary | ICD-10-CM | POA: Diagnosis not present

## 2019-09-17 DIAGNOSIS — Z0001 Encounter for general adult medical examination with abnormal findings: Secondary | ICD-10-CM | POA: Diagnosis not present

## 2019-09-24 ENCOUNTER — Telehealth (INDEPENDENT_AMBULATORY_CARE_PROVIDER_SITE_OTHER): Payer: Medicare Other | Admitting: Cardiology

## 2019-09-24 ENCOUNTER — Encounter: Payer: Self-pay | Admitting: *Deleted

## 2019-09-24 ENCOUNTER — Encounter: Payer: Self-pay | Admitting: Cardiology

## 2019-09-24 VITALS — BP 132/78 | HR 69 | Ht 70.0 in | Wt 178.0 lb

## 2019-09-24 DIAGNOSIS — I25119 Atherosclerotic heart disease of native coronary artery with unspecified angina pectoris: Secondary | ICD-10-CM

## 2019-09-24 DIAGNOSIS — E782 Mixed hyperlipidemia: Secondary | ICD-10-CM

## 2019-09-24 NOTE — Patient Instructions (Addendum)

## 2019-09-24 NOTE — Progress Notes (Signed)
Virtual Visit via Telephone Note   This visit type was conducted due to national recommendations for restrictions regarding the COVID-19 Pandemic (e.g. social distancing) in an effort to limit this patient's exposure and mitigate transmission in our community.  Due to his co-morbid illnesses, this patient is at least at moderate risk for complications without adequate follow up.  This format is felt to be most appropriate for this patient at this time.  The patient did not have access to video technology/had technical difficulties with video requiring transitioning to audio format only (telephone).  All issues noted in this document were discussed and addressed.  No physical exam could be performed with this format.  Please refer to the patient's chart for his  consent to telehealth for [facility].   Date:  09/24/2019   ID:  David Harmon, DOB 1941/04/07, MRN [id number]  Patient Location: Home Provider Location: Office  PCP:  Caryl Bis, MD  Cardiologist:  Rozann Lesches, MD Electrophysiologist:  None   Evaluation Performed:  Follow-Up Visit  Chief Complaint:   Cardiac follow-up  History of Present Illness:    David Harmon is a 78 y.o. adult last seen in October 2019.  We spoke by phone today.  He continues to do very well, no angina symptoms, NYHA class II dyspnea.  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  He also is functional with ADLs and does regular yard work.  He reports having a recent visit with Dr. Quillian Quince, we are requesting his lab work.  I reviewed his medications which are stable and outlined below.  Last ischemic testing was in 2017.  We have continued with observation in the absence of angina symptoms.  The patient does not have symptoms concerning for COVID-19 infection (fever, chills, cough, or new shortness of breath).    Past Medical History:  Diagnosis Date  . Arthritis   .  Coronary atherosclerosis of native coronary artery    DES RCA 11/09 (Dr. Terrence Dupont)  . Essential hypertension, benign   . History of kidney stones   . Mixed hyperlipidemia   . Myocardial infarction [facility])    NSTEMI 11/09   Past Surgical History:  Procedure Laterality Date  . CARDIAC CATHETERIZATION     with stent  . CATARACT EXTRACTION W/PHACO Right 07/28/2017   Procedure: CATARACT EXTRACTION PHACO AND INTRAOCULAR LENS PLACEMENT RIGHT EYE;  Surgeon: Tonny Branch, MD;  Location: [facility];  Service: Ophthalmology;  Laterality: Right;  CDE: 8.33  . EXPLORATORY LAPAROTOMY  2007   Small bowel obstruction  . Resection of pilonidal cyst       Current Meds  Medication Sig  . acetaminophen (TYLENOL) 500 MG tablet Take 1,000 mg by mouth every 6 (six) hours as needed for mild pain or moderate pain.  Marland Kitchen aspirin EC 81 MG tablet Take 81 mg by mouth daily.  Marland Kitchen atorvastatin (LIPITOR) 40 MG tablet Take 40 mg by mouth daily.    . clopidogrel (PLAVIX) 75 MG tablet TAKE 1 TABLET(75 MG) BY MOUTH DAILY  . lisinopril-hydrochlorothiazide (PRINZIDE,ZESTORETIC) 20-12.5 MG per tablet Take 1 tablet by mouth daily.   . Magnesium 500 MG TABS Take 1 tablet by mouth daily.  . metoprolol succinate (TOPROL-XL) 25 MG 24 hr tablet TAKE 1 TABLET(25 MG) BY MOUTH DAILY  . Multiple Vitamins-Minerals (DAILY MENS HEALTH FORMULA PO) Take 1 tablet by mouth daily.   Marland Kitchen  pantoprazole (PROTONIX) 40 MG tablet Take 1 tablet (40 mg total) by mouth daily. (Patient taking differently: Take 40 mg by mouth daily as needed. )     Allergies:   Patient has no known allergies.   Social History   Tobacco Use  . Smoking status: Former Smoker    Packs/day: 0.50    Years: 10.00    Pack years: 5.00    Types: Cigarettes    Quit date: 12/16/1965    Years since quitting: 53.8  . Smokeless tobacco: Never Used  . Tobacco comment: Has not smoked since 41 yrs.  Substance Use Topics  . Alcohol use: No    Alcohol/week: 0.0 standard drinks  . Drug use:  No     Family Hx: The patient's family history includes Coronary artery disease in his father and mother; Diabetes type II in his sister.  ROS:   Please see the history of present illness. All other systems reviewed and are negative.   Prior CV studies:   The following studies were reviewed today:  Lexiscan Myoview 08/29/2016:  There was no ST segment deviation noted during stress.  Findings consistent with prior inferior myocardial infarction without peri-infarct ischemia.  This is a low risk study. There is no significant myocardium currently at jeopardy.  The left ventricular ejection fraction is normal (55-65%).  Labs/Other Tests and Data Reviewed:    EKG:  An ECG dated 09/17/2018 was personally reviewed today and demonstrated:  Sinus rhythm with incomplete right bundle branch block and increased voltage.  Recent Labs:  July 2019: Cholesterol 128, triglycerides 86, HDL 40, LDL 71, BUN 14, creatinine 0.96, potassium 5.0, AST 20, ALT 16, TSH 6.91, hemoglobin 14.9, platelets 261  Wt Readings from Last 3 Encounters:  09/24/19 178 lb (80.7 kg)  09/17/18 172 lb (78 kg)  09/15/17 181 lb (82.1 kg)     Objective:    Vital Signs:  BP 132/78   Pulse 69   Ht 5\' 10"  (1.778 m)   Wt 178 lb (80.7 kg)   BMI 25.54 kg/m    Patient spoke in full sentences, not short of breath. No audible wheezing or coughing. Speech pattern normal.  ASSESSMENT & PLAN:    1.  CAD status post DES to the RCA in 2009.  He is doing well without active angina symptoms on medical therapy.  We will request his recent lab work from Dr. Quillian Quince.  Office follow-up in 6 months for ECG.  No clear indication for repeat ischemic testing at this time.  2.  Mixed hyperlipidemia.  He continues on Lipitor.  Recent lab work requested.  COVID-19 Education: The signs and symptoms of COVID-19 were discussed with the patient and how to seek care for testing (follow up with PCP or arrange E-visit).  The importance of  social distancing was discussed today.  Time:   Today, I have spent 5 minutes with the patient with telehealth technology discussing the above problems.     Medication Adjustments/Labs and Tests Ordered: Current medicines are reviewed at length with the patient today.  Concerns regarding medicines are outlined above.   Tests Ordered: No orders of the defined types were placed in this encounter.   Medication Changes: No orders of the defined types were placed in this encounter.   Follow Up:  6 months in the [place] office with ECG.  Signed, Rozann Lesches, MD  09/24/2019 10:07 AM    [place]

## 2019-10-25 DIAGNOSIS — M25552 Pain in left hip: Secondary | ICD-10-CM | POA: Diagnosis not present

## 2019-11-09 ENCOUNTER — Other Ambulatory Visit: Payer: Self-pay | Admitting: *Deleted

## 2019-11-09 MED ORDER — CLOPIDOGREL BISULFATE 75 MG PO TABS: ORAL_TABLET | ORAL | 2 refills | Status: DC

## 2020-02-22 DIAGNOSIS — M545 Low back pain: Secondary | ICD-10-CM | POA: Diagnosis not present

## 2020-02-28 DIAGNOSIS — M545 Low back pain: Secondary | ICD-10-CM | POA: Diagnosis not present

## 2020-02-28 DIAGNOSIS — M5136 Other intervertebral disc degeneration, lumbar region: Secondary | ICD-10-CM | POA: Diagnosis not present

## 2020-03-15 DIAGNOSIS — E7849 Other hyperlipidemia: Secondary | ICD-10-CM | POA: Diagnosis not present

## 2020-03-15 DIAGNOSIS — I10 Essential (primary) hypertension: Secondary | ICD-10-CM | POA: Diagnosis not present

## 2020-03-23 DIAGNOSIS — E782 Mixed hyperlipidemia: Secondary | ICD-10-CM | POA: Diagnosis not present

## 2020-03-23 DIAGNOSIS — K219 Gastro-esophageal reflux disease without esophagitis: Secondary | ICD-10-CM | POA: Diagnosis not present

## 2020-03-23 DIAGNOSIS — N183 Chronic kidney disease, stage 3 unspecified: Secondary | ICD-10-CM | POA: Diagnosis not present

## 2020-03-23 DIAGNOSIS — Q446 Cystic disease of liver: Secondary | ICD-10-CM | POA: Diagnosis not present

## 2020-03-23 DIAGNOSIS — I10 Essential (primary) hypertension: Secondary | ICD-10-CM | POA: Diagnosis not present

## 2020-03-31 DIAGNOSIS — I10 Essential (primary) hypertension: Secondary | ICD-10-CM | POA: Diagnosis not present

## 2020-03-31 DIAGNOSIS — Q446 Cystic disease of liver: Secondary | ICD-10-CM | POA: Diagnosis not present

## 2020-03-31 DIAGNOSIS — I2510 Atherosclerotic heart disease of native coronary artery without angina pectoris: Secondary | ICD-10-CM | POA: Diagnosis not present

## 2020-03-31 DIAGNOSIS — N281 Cyst of kidney, acquired: Secondary | ICD-10-CM | POA: Diagnosis not present

## 2020-03-31 DIAGNOSIS — E782 Mixed hyperlipidemia: Secondary | ICD-10-CM | POA: Diagnosis not present

## 2020-04-14 DIAGNOSIS — I10 Essential (primary) hypertension: Secondary | ICD-10-CM | POA: Diagnosis not present

## 2020-04-14 DIAGNOSIS — E7849 Other hyperlipidemia: Secondary | ICD-10-CM | POA: Diagnosis not present

## 2020-04-25 ENCOUNTER — Encounter: Payer: Self-pay | Admitting: Cardiology

## 2020-04-25 NOTE — Progress Notes (Signed)
Cardiology Office Note  Date: 04/26/2020   ID: David Harmon, DOB 08/14/1941, MRN [id number]  PCP:  Caryl Bis, MD  Cardiologist:  Rozann Lesches, MD Electrophysiologist:  None   Chief Complaint  Patient presents with  . Cardiac follow-up    History of Present Illness: David Harmon is a 79 y.o. adult last assessed via telehealth encounter in October 2020.  He is here for a routine visit.  States that he is doing well, still drives a truck for [employer].  He does not report any angina symptoms or unusual shortness of breath with typical activities.  I reviewed his medications which are outlined below.  He reports compliance and no obvious intolerances.  He had a physical with lab work at [facility] recently, we are requesting the results.  Personally reviewed his ECG today which shows sinus rhythm with PAC, right bundle branch block and left anterior fascicular block.  Past Medical History:  Diagnosis Date  . Arthritis   . Coronary atherosclerosis of native coronary artery    DES RCA 11/09 (Dr. Terrence Dupont)  . Essential hypertension   . History of kidney stones   . Mixed hyperlipidemia   . Myocardial infarction [facility])    NSTEMI 11/09    Past Surgical History:  Procedure Laterality Date  . CARDIAC CATHETERIZATION     with stent  . CATARACT EXTRACTION W/PHACO Right 07/28/2017   Procedure: CATARACT EXTRACTION PHACO AND INTRAOCULAR LENS PLACEMENT RIGHT EYE;  Surgeon: Tonny Branch, MD;  Location: [facility];  Service: Ophthalmology;  Laterality: Right;  CDE: 8.33  . EXPLORATORY LAPAROTOMY  2007   Small bowel obstruction  . Resection of pilonidal cyst      Current Outpatient Medications  Medication Sig Dispense Refill  . acetaminophen (TYLENOL) 500 MG tablet Take 1,000 mg by mouth every 6 (six) hours as needed for mild pain or moderate pain.    Marland Kitchen aspirin EC 81 MG tablet Take 81 mg by mouth daily.    Marland Kitchen atorvastatin (LIPITOR) 40 MG tablet Take 40 mg by mouth daily.       . clopidogrel (PLAVIX) 75 MG tablet TAKE 1 TABLET(75 MG) BY MOUTH DAILY 90 tablet 2  . lisinopril-hydrochlorothiazide (PRINZIDE,ZESTORETIC) 20-12.5 MG per tablet Take 1 tablet by mouth daily.     . Magnesium 500 MG TABS Take 1 tablet by mouth daily.    . metoprolol succinate (TOPROL-XL) 25 MG 24 hr tablet TAKE 1 TABLET(25 MG) BY MOUTH DAILY 90 tablet 3  . Multiple Vitamins-Minerals (DAILY MENS HEALTH FORMULA PO) Take 1 tablet by mouth daily.     . pantoprazole (PROTONIX) 40 MG tablet Take 1 tablet (40 mg total) by mouth daily. (Patient taking differently: Take 40 mg by mouth daily as needed. ) 30 tablet 1   No current facility-administered medications for this visit.   Allergies:  Patient has no known allergies.   ROS:   No palpitations or syncope.  Physical Exam: VS:  BP (!) 142/88   Pulse 60   Ht 5\' 10"  (1.778 m)   Wt 185 lb (83.9 kg)   SpO2 98%   BMI 26.54 kg/m , BMI Body mass index is 26.54 kg/m.  Wt Readings from Last 3 Encounters:  04/26/20 185 lb (83.9 kg)  09/24/19 178 lb (80.7 kg)  09/17/18 172 lb (78 kg)    General: Patient appears comfortable at rest. HEENT: Conjunctiva and lids normal, wearing a mask. Neck: Supple, no elevated JVP or carotid bruits, no thyromegaly.  Lungs: Clear to auscultation, nonlabored breathing at rest. Cardiac: Regular rate and rhythm, no S3 or significant systolic murmur, no pericardial rub. Extremities: No pitting edema, distal pulses 2+.  ECG:  An ECG dated 09/17/2018 was personally reviewed today and demonstrated:  Sinus rhythm with incomplete right bundle branch block and increased voltage.  Recent Labwork:  No interval lab work for review today.  Other Studies Reviewed Today:  Lexiscan Myoview 08/29/2016:  There was no ST segment deviation noted during stress.  Findings consistent with prior inferior myocardial infarction without peri-infarct ischemia.  This is a low risk study. There is no significant myocardium currently at  jeopardy.  The left ventricular ejection fraction is normal (55-65%).  Assessment and Plan:  1.  CAD status post DES to the RCA in 2009.  He has had no angina symptoms on medical therapy.  ECG reviewed and stable.  No clear indication for follow-up ischemic testing at this time.  Continue aspirin, Plavix, Lipitor, Prinzide, and Toprol-XL.  2.  Mixed hyperlipidemia on Lipitor.  Requesting recent lab work from Dr. Quillian Quince.  Medication Adjustments/Labs and Tests Ordered: Current medicines are reviewed at length with the patient today.  Concerns regarding medicines are outlined above.   Tests Ordered: Orders Placed This Encounter  Procedures  . EKG 12-Lead    Medication Changes: No orders of the defined types were placed in this encounter.   Disposition:  Follow up 6 months in the [place] office.  Signed, Satira Sark, MD, [facility] 04/26/2020 1:57 PM    [facility] [place] [place], [place], [place] [address] Phone: [phone]; Fax: [phone]

## 2020-04-26 ENCOUNTER — Encounter: Payer: Self-pay | Admitting: *Deleted

## 2020-04-26 ENCOUNTER — Ambulatory Visit (INDEPENDENT_AMBULATORY_CARE_PROVIDER_SITE_OTHER): Payer: Medicare Other | Admitting: Cardiology

## 2020-04-26 ENCOUNTER — Other Ambulatory Visit: Payer: Self-pay

## 2020-04-26 ENCOUNTER — Encounter: Payer: Self-pay | Admitting: Cardiology

## 2020-04-26 VITALS — BP 142/88 | HR 60 | Ht 70.0 in | Wt 185.0 lb

## 2020-04-26 DIAGNOSIS — I25119 Atherosclerotic heart disease of native coronary artery with unspecified angina pectoris: Secondary | ICD-10-CM

## 2020-04-26 DIAGNOSIS — E782 Mixed hyperlipidemia: Secondary | ICD-10-CM | POA: Diagnosis not present

## 2020-04-26 NOTE — Patient Instructions (Signed)
Medication Instructions:    Your physician recommends that you continue on your current medications as directed. Please refer to the Current Medication list given to you today.  Labwork:  NONE  Testing/Procedures:  NONE  Follow-Up:  Your physician recommends that you schedule a follow-up appointment in: 6 months (office)  Any Other Special Instructions Will Be Listed Below (If Applicable).  If you need a refill on your cardiac medications before your next appointment, please call your pharmacy. 

## 2020-05-06 ENCOUNTER — Other Ambulatory Visit: Payer: Self-pay | Admitting: Cardiology

## 2020-05-22 DIAGNOSIS — J329 Chronic sinusitis, unspecified: Secondary | ICD-10-CM | POA: Diagnosis not present

## 2020-06-06 ENCOUNTER — Other Ambulatory Visit: Payer: Self-pay | Admitting: *Deleted

## 2020-06-06 MED ORDER — CLOPIDOGREL BISULFATE 75 MG PO TABS: ORAL_TABLET | ORAL | 2 refills | Status: DC

## 2020-06-14 DIAGNOSIS — I129 Hypertensive chronic kidney disease with stage 1 through stage 4 chronic kidney disease, or unspecified chronic kidney disease: Secondary | ICD-10-CM | POA: Diagnosis not present

## 2020-06-14 DIAGNOSIS — I2510 Atherosclerotic heart disease of native coronary artery without angina pectoris: Secondary | ICD-10-CM | POA: Diagnosis not present

## 2020-06-14 DIAGNOSIS — Z87891 Personal history of nicotine dependence: Secondary | ICD-10-CM | POA: Diagnosis not present

## 2020-06-14 DIAGNOSIS — N183 Chronic kidney disease, stage 3 unspecified: Secondary | ICD-10-CM | POA: Diagnosis not present

## 2020-08-10 ENCOUNTER — Other Ambulatory Visit: Payer: Self-pay | Admitting: Cardiology

## 2020-08-15 DIAGNOSIS — N183 Chronic kidney disease, stage 3 unspecified: Secondary | ICD-10-CM | POA: Diagnosis not present

## 2020-08-15 DIAGNOSIS — I2510 Atherosclerotic heart disease of native coronary artery without angina pectoris: Secondary | ICD-10-CM | POA: Diagnosis not present

## 2020-08-15 DIAGNOSIS — I129 Hypertensive chronic kidney disease with stage 1 through stage 4 chronic kidney disease, or unspecified chronic kidney disease: Secondary | ICD-10-CM | POA: Diagnosis not present

## 2020-09-14 DIAGNOSIS — I2510 Atherosclerotic heart disease of native coronary artery without angina pectoris: Secondary | ICD-10-CM | POA: Diagnosis not present

## 2020-09-14 DIAGNOSIS — I129 Hypertensive chronic kidney disease with stage 1 through stage 4 chronic kidney disease, or unspecified chronic kidney disease: Secondary | ICD-10-CM | POA: Diagnosis not present

## 2020-09-14 DIAGNOSIS — N183 Chronic kidney disease, stage 3 unspecified: Secondary | ICD-10-CM | POA: Diagnosis not present

## 2020-09-14 DIAGNOSIS — Z87891 Personal history of nicotine dependence: Secondary | ICD-10-CM | POA: Diagnosis not present

## 2020-09-29 DIAGNOSIS — I10 Essential (primary) hypertension: Secondary | ICD-10-CM | POA: Diagnosis not present

## 2020-09-29 DIAGNOSIS — N183 Chronic kidney disease, stage 3 unspecified: Secondary | ICD-10-CM | POA: Diagnosis not present

## 2020-09-29 DIAGNOSIS — R5383 Other fatigue: Secondary | ICD-10-CM | POA: Diagnosis not present

## 2020-09-29 DIAGNOSIS — K219 Gastro-esophageal reflux disease without esophagitis: Secondary | ICD-10-CM | POA: Diagnosis not present

## 2020-09-29 DIAGNOSIS — E782 Mixed hyperlipidemia: Secondary | ICD-10-CM | POA: Diagnosis not present

## 2020-09-29 DIAGNOSIS — Z0001 Encounter for general adult medical examination with abnormal findings: Secondary | ICD-10-CM | POA: Diagnosis not present

## 2020-10-06 DIAGNOSIS — Q446 Cystic disease of liver: Secondary | ICD-10-CM | POA: Diagnosis not present

## 2020-10-06 DIAGNOSIS — E782 Mixed hyperlipidemia: Secondary | ICD-10-CM | POA: Diagnosis not present

## 2020-10-06 DIAGNOSIS — Z23 Encounter for immunization: Secondary | ICD-10-CM | POA: Diagnosis not present

## 2020-10-06 DIAGNOSIS — M19049 Primary osteoarthritis, unspecified hand: Secondary | ICD-10-CM | POA: Diagnosis not present

## 2020-10-06 DIAGNOSIS — Z1212 Encounter for screening for malignant neoplasm of rectum: Secondary | ICD-10-CM | POA: Diagnosis not present

## 2020-10-06 DIAGNOSIS — N281 Cyst of kidney, acquired: Secondary | ICD-10-CM | POA: Diagnosis not present

## 2020-10-06 DIAGNOSIS — Z0001 Encounter for general adult medical examination with abnormal findings: Secondary | ICD-10-CM | POA: Diagnosis not present

## 2020-10-14 DIAGNOSIS — I129 Hypertensive chronic kidney disease with stage 1 through stage 4 chronic kidney disease, or unspecified chronic kidney disease: Secondary | ICD-10-CM | POA: Diagnosis not present

## 2020-10-14 DIAGNOSIS — N183 Chronic kidney disease, stage 3 unspecified: Secondary | ICD-10-CM | POA: Diagnosis not present

## 2020-10-14 DIAGNOSIS — I2510 Atherosclerotic heart disease of native coronary artery without angina pectoris: Secondary | ICD-10-CM | POA: Diagnosis not present

## 2020-10-14 DIAGNOSIS — Z87891 Personal history of nicotine dependence: Secondary | ICD-10-CM | POA: Diagnosis not present

## 2020-10-31 ENCOUNTER — Ambulatory Visit: Payer: Medicare Other | Admitting: Cardiology

## 2020-10-31 ENCOUNTER — Encounter: Payer: Self-pay | Admitting: *Deleted

## 2020-10-31 ENCOUNTER — Encounter: Payer: Self-pay | Admitting: Cardiology

## 2020-10-31 VITALS — BP 142/98 | HR 70 | Ht 70.0 in | Wt 178.0 lb

## 2020-10-31 DIAGNOSIS — I25119 Atherosclerotic heart disease of native coronary artery with unspecified angina pectoris: Secondary | ICD-10-CM

## 2020-10-31 DIAGNOSIS — E782 Mixed hyperlipidemia: Secondary | ICD-10-CM

## 2020-10-31 NOTE — Progress Notes (Signed)
Cardiology Office Note  Date: 10/31/2020   ID: David Harmon, DOB 02-09-1941, MRN [id number]  PCP:  Caryl Bis, MD  Cardiologist:  Rozann Lesches, MD Electrophysiologist:  None   Chief Complaint  Patient presents with  . Cardiac follow-up    History of Present Illness: David Harmon is a 79 y.o. adult last seen in May.  He presents for a routine visit.  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  His wife has had some health challenges and he is now helping her at home.  He does not report any active angina on medical therapy, states that he had a physical with Dr. Quillian Quince recently and had lab work.  We are requesting these results for review.  I reviewed his medications which are outlined below.  He does not report any intolerances.  No bleeding problems on aspirin and Plavix.  Past Medical History:  Diagnosis Date  . Arthritis   . Coronary atherosclerosis of native coronary artery    DES RCA 11/09 (Dr. Terrence Dupont)  . Essential hypertension   . History of kidney stones   . Mixed hyperlipidemia   . Myocardial infarction [facility])    NSTEMI 11/09    Past Surgical History:  Procedure Laterality Date  . CARDIAC CATHETERIZATION     with stent  . CATARACT EXTRACTION W/PHACO Right 07/28/2017   Procedure: CATARACT EXTRACTION PHACO AND INTRAOCULAR LENS PLACEMENT RIGHT EYE;  Surgeon: Tonny Branch, MD;  Location: [facility];  Service: Ophthalmology;  Laterality: Right;  CDE: 8.33  . EXPLORATORY LAPAROTOMY  2007   Small bowel obstruction  . Resection of pilonidal cyst      Current Outpatient Medications  Medication Sig Dispense Refill  . acetaminophen (TYLENOL) 500 MG tablet Take 1,000 mg by mouth every 6 (six) hours as needed for mild pain or moderate pain.    Marland Kitchen aspirin EC 81 MG tablet Take 81 mg by mouth daily.    Marland Kitchen atorvastatin (LIPITOR) 40 MG tablet Take 40 mg by mouth daily.      . clopidogrel (PLAVIX) 75 MG tablet TAKE 1  TABLET(75 MG) BY MOUTH DAILY 90 tablet 2  . lisinopril-hydrochlorothiazide (PRINZIDE,ZESTORETIC) 20-12.5 MG per tablet Take 1 tablet by mouth daily.     . Magnesium 500 MG TABS Take 1 tablet by mouth daily.    . metoprolol succinate (TOPROL-XL) 25 MG 24 hr tablet TAKE 1 TABLET(25 MG) BY MOUTH DAILY 90 tablet 1  . Multiple Vitamins-Minerals (DAILY MENS HEALTH FORMULA PO) Take 1 tablet by mouth daily.     . pantoprazole (PROTONIX) 40 MG tablet Take 1 tablet (40 mg total) by mouth daily. (Patient taking differently: Take 40 mg by mouth daily as needed. ) 30 tablet 1   No current facility-administered medications for this visit.   Allergies:  Patient has no known allergies.   ROS: No palpitations or syncope.  Physical Exam: VS:  BP (!) 142/98   Pulse 70   Ht 5\' 10"  (1.778 m)   Wt 178 lb (80.7 kg)   SpO2 98%   BMI 25.54 kg/m , BMI Body mass index is 25.54 kg/m.  Wt Readings from Last 3 Encounters:  10/31/20 178 lb (80.7 kg)  04/26/20 185 lb (83.9 kg)  09/24/19 178 lb (80.7 kg)    General: Patient appears comfortable at rest. HEENT: Conjunctiva and lids normal, wearing a mask. Neck: Supple, no elevated JVP or carotid bruits, no thyromegaly. Lungs:  Clear to auscultation, nonlabored breathing at rest. Cardiac: Regular rate and rhythm, no S3 or significant systolic murmur. Extremities: No pitting edema.  ECG:  An ECG dated 04/26/2020 was personally reviewed today and demonstrated:  Sinus rhythm with PAC, right bundle branch block and left anterior fascicular block.  Recent Labwork:  No interval lab work for review today.  Other Studies Reviewed Today:  Lexiscan Myoview 08/29/2016:  There was no ST segment deviation noted during stress.  Findings consistent with prior inferior myocardial infarction without peri-infarct ischemia.  This is a low risk study. There is no significant myocardium currently at jeopardy.  The left ventricular ejection fraction is normal  (55-65%).  Assessment and Plan:  1.  CAD status post DES to the RCA in 2009.  We will continue medical therapy and observation in the absence of angina symptoms.  Continue aspirin, Plavix, Lipitor, Toprol-XL, and Prinzide.  Requesting interval lab work from Dr. Quillian Quince.  2.  Mixed hyperlipidemia on Lipitor.  Medication Adjustments/Labs and Tests Ordered: Current medicines are reviewed at length with the patient today.  Concerns regarding medicines are outlined above.   Tests Ordered: No orders of the defined types were placed in this encounter.   Medication Changes: No orders of the defined types were placed in this encounter.   Disposition:  Follow up 6 months in the [place] office.  Signed, Satira Sark, MD, [facility] 10/31/2020 4:18 PM    [facility] at [place], [place], [place] [address] Phone: [phone]; Fax: [phone]

## 2020-10-31 NOTE — Patient Instructions (Addendum)

## 2020-11-02 ENCOUNTER — Other Ambulatory Visit: Payer: Self-pay | Admitting: Cardiology

## 2020-11-22 DIAGNOSIS — R109 Unspecified abdominal pain: Secondary | ICD-10-CM | POA: Diagnosis not present

## 2020-11-30 DIAGNOSIS — Z09 Encounter for follow-up examination after completed treatment for conditions other than malignant neoplasm: Secondary | ICD-10-CM | POA: Diagnosis not present

## 2020-11-30 DIAGNOSIS — B9681 Helicobacter pylori [H. pylori] as the cause of diseases classified elsewhere: Secondary | ICD-10-CM | POA: Diagnosis not present

## 2020-12-15 DIAGNOSIS — Z87891 Personal history of nicotine dependence: Secondary | ICD-10-CM | POA: Diagnosis not present

## 2020-12-15 DIAGNOSIS — I2510 Atherosclerotic heart disease of native coronary artery without angina pectoris: Secondary | ICD-10-CM | POA: Diagnosis not present

## 2020-12-15 DIAGNOSIS — I129 Hypertensive chronic kidney disease with stage 1 through stage 4 chronic kidney disease, or unspecified chronic kidney disease: Secondary | ICD-10-CM | POA: Diagnosis not present

## 2020-12-15 DIAGNOSIS — N183 Chronic kidney disease, stage 3 unspecified: Secondary | ICD-10-CM | POA: Diagnosis not present

## 2020-12-27 DIAGNOSIS — H524 Presbyopia: Secondary | ICD-10-CM | POA: Diagnosis not present

## 2021-01-08 DIAGNOSIS — Z23 Encounter for immunization: Secondary | ICD-10-CM | POA: Diagnosis not present

## 2021-01-22 DIAGNOSIS — N281 Cyst of kidney, acquired: Secondary | ICD-10-CM | POA: Diagnosis not present

## 2021-01-22 DIAGNOSIS — K7689 Other specified diseases of liver: Secondary | ICD-10-CM | POA: Diagnosis not present

## 2021-01-23 DIAGNOSIS — M7582 Other shoulder lesions, left shoulder: Secondary | ICD-10-CM | POA: Diagnosis not present

## 2021-04-05 DIAGNOSIS — E782 Mixed hyperlipidemia: Secondary | ICD-10-CM | POA: Diagnosis not present

## 2021-04-05 DIAGNOSIS — K2100 Gastro-esophageal reflux disease with esophagitis, without bleeding: Secondary | ICD-10-CM | POA: Diagnosis not present

## 2021-04-05 DIAGNOSIS — D692 Other nonthrombocytopenic purpura: Secondary | ICD-10-CM | POA: Diagnosis not present

## 2021-04-05 DIAGNOSIS — Z23 Encounter for immunization: Secondary | ICD-10-CM | POA: Diagnosis not present

## 2021-04-05 DIAGNOSIS — E7849 Other hyperlipidemia: Secondary | ICD-10-CM | POA: Diagnosis not present

## 2021-04-05 DIAGNOSIS — I10 Essential (primary) hypertension: Secondary | ICD-10-CM | POA: Diagnosis not present

## 2021-04-05 DIAGNOSIS — I25111 Atherosclerotic heart disease of native coronary artery with angina pectoris with documented spasm: Secondary | ICD-10-CM | POA: Diagnosis not present

## 2021-04-05 DIAGNOSIS — Z1212 Encounter for screening for malignant neoplasm of rectum: Secondary | ICD-10-CM | POA: Diagnosis not present

## 2021-04-05 DIAGNOSIS — Q446 Cystic disease of liver: Secondary | ICD-10-CM | POA: Diagnosis not present

## 2021-04-05 DIAGNOSIS — N281 Cyst of kidney, acquired: Secondary | ICD-10-CM | POA: Diagnosis not present

## 2021-04-05 DIAGNOSIS — I2510 Atherosclerotic heart disease of native coronary artery without angina pectoris: Secondary | ICD-10-CM | POA: Diagnosis not present

## 2021-04-14 DIAGNOSIS — I129 Hypertensive chronic kidney disease with stage 1 through stage 4 chronic kidney disease, or unspecified chronic kidney disease: Secondary | ICD-10-CM | POA: Diagnosis not present

## 2021-04-14 DIAGNOSIS — Z87891 Personal history of nicotine dependence: Secondary | ICD-10-CM | POA: Diagnosis not present

## 2021-04-14 DIAGNOSIS — I2510 Atherosclerotic heart disease of native coronary artery without angina pectoris: Secondary | ICD-10-CM | POA: Diagnosis not present

## 2021-04-14 DIAGNOSIS — N183 Chronic kidney disease, stage 3 unspecified: Secondary | ICD-10-CM | POA: Diagnosis not present

## 2021-05-01 ENCOUNTER — Ambulatory Visit: Payer: Medicare Other | Admitting: Cardiology

## 2021-05-01 ENCOUNTER — Other Ambulatory Visit: Payer: Self-pay | Admitting: Cardiology

## 2021-05-08 DIAGNOSIS — M7582 Other shoulder lesions, left shoulder: Secondary | ICD-10-CM | POA: Diagnosis not present

## 2021-05-10 NOTE — Progress Notes (Signed)
Cardiology Office Note  Date: 05/11/2021   ID: David Harmon, DOB 13-Jul-1941, MRN [id number]  PCP:  David Bis, MD  Cardiologist:  David Lesches, MD Electrophysiologist:  None   Chief Complaint: 25-month follow-up  History of Present Illness: David Harmon is a 80 y.o. adult with a history of CAD/MI status post DES to RCA 2009, HTN, HLD.  Last seen by Dr. Domenic Harmon on 10/31/2020.  He denied any active angina on medical therapy.  He had recently undergone a physical with his primary care physician with lab work.  Lab work was requested.  Continuing current therapy for CAD in absence of anginal symptoms.  Current medications include aspirin, Plavix, Lipitor, Toprol-XL, and Prinzide.  He is here today for 65-month follow-up.  He denies any acute illnesses or hospitalizations in the interim since last visit.  States he recently saw his primary care provider and had no issues.  Blood pressure is elevated today initially at 228/100.  Recheck in left arm was 160/100.  He he took his antihypertensive medications around 630 this morning.  He states his recent systolic blood pressure at his PCP office was 118 and has been in that range historically.  Otherwise he denies any anginal or exertional symptoms, palpitations or arrhythmias, orthostatic symptoms, SOB or DOE, PND, orthopnea, claudication-like symptoms, DVT or PE-like symptoms, or lower extremity edema.  No bleeding on Plavix and aspirin.  Past Medical History:  Diagnosis Date  . Arthritis   . Coronary atherosclerosis of native coronary artery    DES RCA 11/09 (Dr. Terrence Dupont)  . Essential hypertension   . History of kidney stones   . Mixed hyperlipidemia   . Myocardial infarction [facility])    NSTEMI 11/09    Past Surgical History:  Procedure Laterality Date  . CARDIAC CATHETERIZATION     with stent  . CATARACT EXTRACTION W/PHACO Right 07/28/2017   Procedure: CATARACT EXTRACTION PHACO AND INTRAOCULAR LENS PLACEMENT RIGHT EYE;   Surgeon: David Branch, MD;  Location: [facility];  Service: Ophthalmology;  Laterality: Right;  CDE: 8.33  . EXPLORATORY LAPAROTOMY  2007   Small bowel obstruction  . Resection of pilonidal cyst      Current Outpatient Medications  Medication Sig Dispense Refill  . acetaminophen (TYLENOL) 500 MG tablet Take 1,000 mg by mouth every 6 (six) hours as needed for mild pain or moderate pain.    Marland Kitchen aspirin EC 81 MG tablet Take 81 mg by mouth daily.    Marland Kitchen atorvastatin (LIPITOR) 40 MG tablet Take 40 mg by mouth daily.    . clopidogrel (PLAVIX) 75 MG tablet TAKE 1 TABLET(75 MG) BY MOUTH DAILY 90 tablet 2  . lisinopril-hydrochlorothiazide (PRINZIDE,ZESTORETIC) 20-12.5 MG per tablet Take 1 tablet by mouth daily.     . Magnesium 500 MG TABS Take 1 tablet by mouth daily.    . metoprolol succinate (TOPROL-XL) 25 MG 24 hr tablet TAKE 1 TABLET(25 MG) BY MOUTH DAILY 90 tablet 3  . Multiple Vitamins-Minerals (DAILY MENS HEALTH FORMULA PO) Take 1 tablet by mouth daily.    . pantoprazole (PROTONIX) 40 MG tablet Take 1 tablet (40 mg total) by mouth daily. (Patient taking differently: Take 40 mg by mouth daily as needed.) 30 tablet 1   No current facility-administered medications for this visit.   Allergies:  Patient has no known allergies.   Social History: The patient  reports that he quit smoking about 55 years ago. His smoking use included cigarettes. He has a 5.00 pack-year  smoking history. He has never used smokeless tobacco. He reports that he does not drink alcohol and does not use drugs.   Family History: The patient's family history includes Coronary artery disease in his father and mother; Diabetes type II in his sister.   ROS:  Please see the history of present illness. Otherwise, complete review of systems is positive for none.  All other systems are reviewed and negative.   Physical Exam: VS:  BP (!) 228/100   Pulse 64   Ht 5\' 10"  (1.778 m)   Wt 181 lb 3.2 oz (82.2 kg)   SpO2 98%   BMI 26.00 kg/m  , BMI Body mass index is 26 kg/m.  Wt Readings from Last 3 Encounters:  05/11/21 181 lb 3.2 oz (82.2 kg)  10/31/20 178 lb (80.7 kg)  04/26/20 185 lb (83.9 kg)    General: Patient appears comfortable at rest. Neck: Supple, no elevated JVP or carotid bruits, no thyromegaly. Lungs: Clear to auscultation, nonlabored breathing at rest. Cardiac: Regular rate and rhythm, no S3 or significant systolic murmur, no pericardial rub. Extremities: No pitting edema, distal pulses 2+. Skin: Warm and dry. Musculoskeletal: No kyphosis. Neuropsychiatric: Alert and oriented x3, affect grossly appropriate.  ECG:  An ECG dated 05/11/2021 was personally reviewed today and demonstrated:  Normal sinus rhythm with sinus arrhythmia right bundle Harmon block left anterior fascicular block rate of 62.  Recent Labwork: No results found for requested labs within last 8760 hours.     Component Value Date/Time   CHOL 138 08/01/2017 0629   TRIG 125 08/01/2017 0629   HDL 38 (L) 08/01/2017 0629   CHOLHDL 3.6 08/01/2017 0629   VLDL 25 08/01/2017 0629   LDLCALC 75 08/01/2017 0629    Other Studies Reviewed Today:  David Harmon Myoview 08/29/2016:  There was no ST segment deviation noted during stress.  Findings consistent with prior inferior myocardial infarction without peri-infarct ischemia.  This is a low risk study. There is no significant myocardium currently at jeopardy.  The left ventricular ejection fraction is normal (55-65%).  Assessment and Plan:  1. CAD in native artery   2. Mixed hyperlipidemia   3. Essential hypertension, benign    1. CAD in native artery Denies any anginal or exertional symptoms.  Continue aspirin 81 mg daily, Plavix 75 mg daily, Toprol-XL 25 mg daily.  2. Mixed hyperlipidemia Continue atorvastatin 40 mg p.o. daily.Lipid panel at PCP office October 2021: TC 124, TG 83, HDL 37, LDL 71  3. Essential hypertension, benign Blood pressure elevated on arrival today at 228/100.   Recheck in left arm 160/100.  Patient states this is unusual for him.  He took his antihypertensive medications around 630 this morning.  He states at recent PCP visit his systolic blood pressure was 118.  Advised him to check his blood pressures for 1 week and call with results.  Advised him goal is 130/80 or less.  If blood pressure remains consistently elevated may need to titrate medication or add another agent.  Continue lisinopril/HCTZ 20/12 5 mg p.o. daily.  Continue Toprol-XL 25 mg p.o. daily.   Medication Adjustments/Labs and Tests Ordered: Current medicines are reviewed at length with the patient today.  Concerns regarding medicines are outlined above.   Disposition: Follow-up with Dr. Domenic Harmon or APP 6 months  Signed, Levell July, NP 05/11/2021 8:25 AM    [facility] at [place], [place], [place] [address] Phone: [phone]; Fax: [phone]

## 2021-05-11 ENCOUNTER — Encounter: Payer: Self-pay | Admitting: Family Medicine

## 2021-05-11 ENCOUNTER — Ambulatory Visit: Payer: Medicare Other | Admitting: Family Medicine

## 2021-05-11 VITALS — BP 160/100 | HR 64 | Ht 70.0 in | Wt 181.2 lb

## 2021-05-11 DIAGNOSIS — E782 Mixed hyperlipidemia: Secondary | ICD-10-CM | POA: Diagnosis not present

## 2021-05-11 DIAGNOSIS — I10 Essential (primary) hypertension: Secondary | ICD-10-CM

## 2021-05-11 DIAGNOSIS — I2510 Atherosclerotic heart disease of native coronary artery without angina pectoris: Secondary | ICD-10-CM

## 2021-05-11 NOTE — Patient Instructions (Signed)
Medication Instructions:  Continue all current medications.  Labwork: none  Testing/Procedures: none  Follow-Up: 6 months   Any Other Special Instructions Will Be Listed Below (If Applicable). Please call the office in 1 week with update on blood pressure readings.   If you need a refill on your cardiac medications before your next appointment, please call your pharmacy.

## 2021-05-15 DIAGNOSIS — M7582 Other shoulder lesions, left shoulder: Secondary | ICD-10-CM | POA: Diagnosis not present

## 2021-05-18 DIAGNOSIS — I10 Essential (primary) hypertension: Secondary | ICD-10-CM | POA: Diagnosis not present

## 2021-05-24 DIAGNOSIS — Z23 Encounter for immunization: Secondary | ICD-10-CM | POA: Diagnosis not present

## 2021-06-05 DIAGNOSIS — M7582 Other shoulder lesions, left shoulder: Secondary | ICD-10-CM | POA: Diagnosis not present

## 2021-06-26 DIAGNOSIS — H40013 Open angle with borderline findings, low risk, bilateral: Secondary | ICD-10-CM | POA: Diagnosis not present

## 2021-09-14 DIAGNOSIS — N183 Chronic kidney disease, stage 3 unspecified: Secondary | ICD-10-CM | POA: Diagnosis not present

## 2021-09-14 DIAGNOSIS — I129 Hypertensive chronic kidney disease with stage 1 through stage 4 chronic kidney disease, or unspecified chronic kidney disease: Secondary | ICD-10-CM | POA: Diagnosis not present

## 2021-09-14 DIAGNOSIS — Z87891 Personal history of nicotine dependence: Secondary | ICD-10-CM | POA: Diagnosis not present

## 2021-09-14 DIAGNOSIS — I2510 Atherosclerotic heart disease of native coronary artery without angina pectoris: Secondary | ICD-10-CM | POA: Diagnosis not present

## 2021-09-17 DIAGNOSIS — H01005 Unspecified blepharitis left lower eyelid: Secondary | ICD-10-CM | POA: Diagnosis not present

## 2021-09-17 DIAGNOSIS — H01004 Unspecified blepharitis left upper eyelid: Secondary | ICD-10-CM | POA: Diagnosis not present

## 2021-09-17 DIAGNOSIS — H01002 Unspecified blepharitis right lower eyelid: Secondary | ICD-10-CM | POA: Diagnosis not present

## 2021-09-17 DIAGNOSIS — H11153 Pinguecula, bilateral: Secondary | ICD-10-CM | POA: Diagnosis not present

## 2021-09-17 DIAGNOSIS — Z961 Presence of intraocular lens: Secondary | ICD-10-CM | POA: Diagnosis not present

## 2021-09-17 DIAGNOSIS — H02831 Dermatochalasis of right upper eyelid: Secondary | ICD-10-CM | POA: Diagnosis not present

## 2021-09-17 DIAGNOSIS — H01001 Unspecified blepharitis right upper eyelid: Secondary | ICD-10-CM | POA: Diagnosis not present

## 2021-09-17 DIAGNOSIS — H25812 Combined forms of age-related cataract, left eye: Secondary | ICD-10-CM | POA: Diagnosis not present

## 2021-09-17 DIAGNOSIS — H40013 Open angle with borderline findings, low risk, bilateral: Secondary | ICD-10-CM | POA: Diagnosis not present

## 2021-09-17 DIAGNOSIS — H02834 Dermatochalasis of left upper eyelid: Secondary | ICD-10-CM | POA: Diagnosis not present

## 2021-10-08 DIAGNOSIS — N183 Chronic kidney disease, stage 3 unspecified: Secondary | ICD-10-CM | POA: Diagnosis not present

## 2021-10-08 DIAGNOSIS — Q446 Cystic disease of liver: Secondary | ICD-10-CM | POA: Diagnosis not present

## 2021-10-08 DIAGNOSIS — K219 Gastro-esophageal reflux disease without esophagitis: Secondary | ICD-10-CM | POA: Diagnosis not present

## 2021-10-08 DIAGNOSIS — E782 Mixed hyperlipidemia: Secondary | ICD-10-CM | POA: Diagnosis not present

## 2021-10-08 DIAGNOSIS — E7849 Other hyperlipidemia: Secondary | ICD-10-CM | POA: Diagnosis not present

## 2021-10-08 DIAGNOSIS — Z1329 Encounter for screening for other suspected endocrine disorder: Secondary | ICD-10-CM | POA: Diagnosis not present

## 2021-10-09 DIAGNOSIS — Z23 Encounter for immunization: Secondary | ICD-10-CM | POA: Diagnosis not present

## 2021-10-11 DIAGNOSIS — N281 Cyst of kidney, acquired: Secondary | ICD-10-CM | POA: Diagnosis not present

## 2021-10-11 DIAGNOSIS — Q446 Cystic disease of liver: Secondary | ICD-10-CM | POA: Diagnosis not present

## 2021-10-11 DIAGNOSIS — I25111 Atherosclerotic heart disease of native coronary artery with angina pectoris with documented spasm: Secondary | ICD-10-CM | POA: Diagnosis not present

## 2021-10-11 DIAGNOSIS — E7849 Other hyperlipidemia: Secondary | ICD-10-CM | POA: Diagnosis not present

## 2021-10-11 DIAGNOSIS — I10 Essential (primary) hypertension: Secondary | ICD-10-CM | POA: Diagnosis not present

## 2021-10-11 DIAGNOSIS — Z0001 Encounter for general adult medical examination with abnormal findings: Secondary | ICD-10-CM | POA: Diagnosis not present

## 2021-10-11 DIAGNOSIS — D692 Other nonthrombocytopenic purpura: Secondary | ICD-10-CM | POA: Diagnosis not present

## 2021-10-11 DIAGNOSIS — Z23 Encounter for immunization: Secondary | ICD-10-CM | POA: Diagnosis not present

## 2021-10-26 ENCOUNTER — Encounter (HOSPITAL_COMMUNITY): Payer: Self-pay

## 2021-10-26 ENCOUNTER — Other Ambulatory Visit: Payer: Self-pay

## 2021-10-26 ENCOUNTER — Encounter (HOSPITAL_COMMUNITY)
Admission: RE | Admit: 2021-10-26 | Discharge: 2021-10-26 | Disposition: A | Discharge status: Home / Self Care | Payer: Medicare Other | Source: Ambulatory Visit | Attending: Ophthalmology | Admitting: Ophthalmology

## 2021-10-28 ENCOUNTER — Other Ambulatory Visit: Payer: Self-pay | Admitting: Cardiology

## 2021-10-31 NOTE — H&P (Signed)
Surgical History & Physical  Patient Name: David Harmon DOB: 09/14/1941  Surgery: Cataract extraction with intraocular lens implant phacoemulsification; Left Eye  Surgeon: Baruch Goldmann MD Surgery Date:  11-02-21 Pre-Op Date:  10-25-21  HPI: A 31 Yr. old male patient Pt referred by Dr. Rosana Hoes for cataract evaluation. The patient complains of difficulty when viewing TV, reading closed caption, news scrolls on TV, which began 5 years ago. The left eye is affected. The episode is gradual. The condition's severity is worsening. The complaint is associated with blurry vision. Symptoms are negatively affecting pt 's quality of life. Pt using Pataday prn OU. Pt denies any eye pain, or increase in floaters/flashes of light. HPI Completed by Dr. Baruch Goldmann  Medical History: Glaucoma Cataracts Pseudophakic OD Chronic Allergic Conjunctivitis OU Macula Degeneration Glaucoma High Blood Pressure LDL  Review of Systems Negative Allergic/Immunologic Negative Cardiovascular Negative Constitutional Negative Ear, Nose, Mouth & Throat Negative Endocrine Negative Eyes Negative Gastrointestinal Negative Genitourinary Negative Hemotologic/Lymphatic Negative Integumentary Negative Musculoskeletal Negative Neurological Negative Psychiatry Negative Respiratory  Social   Never smoked   Medication Pataday, Amlodipine besylate, Pantoprazole, Atorvastatin, Lisinopril, Metoprolol, Clopidogrel, Magnesium, Aspirin, Arthritis relief,   Sx/Procedures Phaco c IOL, Intestinal Blockage, Stent sx,   Drug Allergies   NKDA  History & Physical: Heent: Cataract, Left Eye NECK: supple without bruits LUNGS: lungs clear to auscultation CV: regular rate and rhythm Abdomen: soft and non-tender  Impression & Plan: Assessment: 1.  COMBINED FORMS AGE RELATED CATARACT; Left Eye (H25.812) 2.  INTRAOCULAR LENS IOL (Z96.1) 3.  BLEPHARITIS; Right Upper Lid, Right Lower Lid, Left Upper Lid, Left Lower Lid  (H01.001, H01.002,H01.004,H01.005) 4.  Pinguecula; Both Eyes (H11.153) 5.  DERMATOCHALASIS; Right Upper Lid, Left Upper Lid (H02.831, H02.834) 6.  OAG BORDERLINE FINDINGS LOW RISK; Both Eyes (H40.013)  Plan: 1.  Cataract accounts for the patient's decreased vision. This visual impairment is not correctable with a tolerable change in glasses or contact lenses. Cataract surgery with an implantation of a new lens should significantly improve the visual and functional status of the patient. Discussed all risks, benefits, alternatives, and potential complications. Discussed the procedures and recovery. Patient desires to have surgery. A-scan ordered and performed today for intra-ocular lens calculations. The surgery will be performed in order to improve vision for driving, reading, and for eye examinations. Recommend phacoemulsification with intra-ocular lens. Recommend Dextenza for post-operative pain and inflammation. Left Eye. Surgery required to correct imbalance of vision. Dilates poorly - shugacaine by protocol. Omidira.  2.  PCB00 +20.5  3.  Recommend regular lid cleaning  4.  Observe; Artificial tears as needed for irritation.  5.  Asymptomatic, recommend observation for now. Findings, prognosis and treatment options reviewed.  6.  Based on cup-to-disc ratio. Negative Family history. OCT rNFL shows: WNL OD, Borderline OS IOPs good today. Detailed discussion about glaucoma today including importance of maintaining good follow up and following treatment plan, and the possibility of irreversible blindness as part of this disease process.

## 2021-11-02 ENCOUNTER — Encounter (HOSPITAL_COMMUNITY): Admission: RE | Payer: Self-pay | Source: Home / Self Care

## 2021-11-02 ENCOUNTER — Encounter (HOSPITAL_COMMUNITY): Payer: Self-pay | Admitting: Certified Registered"

## 2021-11-02 ENCOUNTER — Ambulatory Visit (HOSPITAL_COMMUNITY): Admission: RE | Admit: 2021-11-02 | Payer: Medicare Other | Source: Home / Self Care | Admitting: Ophthalmology

## 2021-11-02 SURGERY — PHACOEMULSIFICATION, CATARACT, WITH IOL INSERTION
Anesthesia: Monitor Anesthesia Care | Laterality: Left

## 2021-11-02 MED ORDER — EPINEPHRINE PF 1 MG/ML IJ SOLN
INTRAMUSCULAR | Status: AC
  Filled 2021-11-02: qty 1

## 2021-11-02 MED ORDER — PHENYLEPHRINE-KETOROLAC 1-0.3 % IO SOLN
INTRAOCULAR | Status: AC
  Filled 2021-11-02: qty 4

## 2021-12-25 DIAGNOSIS — D0461 Carcinoma in situ of skin of right upper limb, including shoulder: Secondary | ICD-10-CM | POA: Diagnosis not present

## 2021-12-25 DIAGNOSIS — C44629 Squamous cell carcinoma of skin of left upper limb, including shoulder: Secondary | ICD-10-CM | POA: Diagnosis not present

## 2021-12-25 DIAGNOSIS — D0462 Carcinoma in situ of skin of left upper limb, including shoulder: Secondary | ICD-10-CM | POA: Diagnosis not present

## 2021-12-25 DIAGNOSIS — D044 Carcinoma in situ of skin of scalp and neck: Secondary | ICD-10-CM | POA: Diagnosis not present

## 2021-12-25 DIAGNOSIS — C4442 Squamous cell carcinoma of skin of scalp and neck: Secondary | ICD-10-CM | POA: Diagnosis not present

## 2021-12-25 DIAGNOSIS — C44329 Squamous cell carcinoma of skin of other parts of face: Secondary | ICD-10-CM | POA: Diagnosis not present

## 2021-12-25 DIAGNOSIS — D0471 Carcinoma in situ of skin of right lower limb, including hip: Secondary | ICD-10-CM | POA: Diagnosis not present

## 2021-12-25 DIAGNOSIS — D0439 Carcinoma in situ of skin of other parts of face: Secondary | ICD-10-CM | POA: Diagnosis not present

## 2021-12-25 DIAGNOSIS — C44622 Squamous cell carcinoma of skin of right upper limb, including shoulder: Secondary | ICD-10-CM | POA: Diagnosis not present

## 2021-12-25 DIAGNOSIS — L570 Actinic keratosis: Secondary | ICD-10-CM | POA: Diagnosis not present

## 2021-12-31 ENCOUNTER — Ambulatory Visit: Payer: Medicare Other | Admitting: Cardiology

## 2021-12-31 NOTE — Progress Notes (Deleted)
Cardiology Office Note  Date: 12/31/2021   ID: VALENTIN BENNEY, DOB 1941-08-01, MRN [id number]  PCP:  Caryl Bis, MD  Cardiologist:  Rozann Lesches, MD Electrophysiologist:  None   No chief complaint on file.   History of Present Illness: ALEXANDRE FARIES is a 81 y.o. adult last seen in May 2022 by Mr. Leonides Sake NP.  Past Medical History:  Diagnosis Date   Arthritis    Coronary atherosclerosis of native coronary artery    DES RCA 11/09 (Dr. Terrence Dupont)   Essential hypertension    History of kidney stones    Mixed hyperlipidemia    Myocardial infarction [facility])    NSTEMI 11/09    Past Surgical History:  Procedure Laterality Date   CARDIAC CATHETERIZATION     with stent   CATARACT EXTRACTION W/PHACO Right 07/28/2017   Procedure: CATARACT EXTRACTION PHACO AND INTRAOCULAR LENS PLACEMENT RIGHT EYE;  Surgeon: Tonny Branch, MD;  Location: [facility];  Service: Ophthalmology;  Laterality: Right;  CDE: 8.33   EXPLORATORY LAPAROTOMY  2007   Small bowel obstruction   Resection of pilonidal cyst      Current Outpatient Medications  Medication Sig Dispense Refill   metoprolol succinate (TOPROL-XL) 25 MG 24 hr tablet TAKE 1 TABLET(25 MG) BY MOUTH DAILY 90 tablet 3   acetaminophen (TYLENOL) 500 MG tablet Take 1,000 mg by mouth every 6 (six) hours as needed for mild pain or moderate pain.     aspirin EC 81 MG tablet Take 81 mg by mouth daily.     atorvastatin (LIPITOR) 40 MG tablet Take 40 mg by mouth daily.     clopidogrel (PLAVIX) 75 MG tablet TAKE 1 TABLET(75 MG) BY MOUTH DAILY 90 tablet 2   lisinopril-hydrochlorothiazide (PRINZIDE,ZESTORETIC) 20-12.5 MG per tablet Take 1 tablet by mouth daily.      Magnesium 500 MG TABS Take 1 tablet by mouth daily.     Multiple Vitamins-Minerals (DAILY MENS HEALTH FORMULA PO) Take 1 tablet by mouth daily.     pantoprazole (PROTONIX) 40 MG tablet Take 1 tablet (40 mg total) by mouth daily. (Patient taking differently: Take 40 mg by mouth daily as  needed.) 30 tablet 1   spironolactone (ALDACTONE) 25 MG tablet Take 25 mg by mouth daily.     No current facility-administered medications for this visit.   Allergies:  Patient has no known allergies.   Social History: The patient  reports that she quit smoking about 56 years ago. Her smoking use included cigarettes. She has a 5.00 pack-year smoking history. She has never used smokeless tobacco. She reports that she does not drink alcohol and does not use drugs.   Family History: The patient's family history includes Coronary artery disease in her father and mother; Diabetes type II in her sister.   ROS:  Please see the history of present illness. Otherwise, complete review of systems is positive for {NONE DEFAULTED:18576}.  All other systems are reviewed and negative.   Physical Exam: VS:  There were no vitals taken for this visit., BMI There is no height or weight on file to calculate BMI.  Wt Readings from Last 3 Encounters:  05/11/21 181 lb 3.2 oz (82.2 kg)  10/31/20 178 lb (80.7 kg)  04/26/20 185 lb (83.9 kg)    General: Patient appears comfortable at rest. HEENT: Conjunctiva and lids normal, oropharynx clear with moist mucosa. Neck: Supple, no elevated JVP or carotid bruits, no thyromegaly. Lungs: Clear to auscultation, nonlabored breathing at rest. Cardiac:  Regular rate and rhythm, no S3 or significant systolic murmur, no pericardial rub. Abdomen: Soft, nontender, no hepatomegaly, bowel sounds present, no guarding or rebound. Extremities: No pitting edema, distal pulses 2+. Skin: Warm and dry. Musculoskeletal: No kyphosis. Neuropsychiatric: Alert and oriented x3, affect grossly appropriate.  ECG:  An ECG dated 05/11/2021 was personally reviewed today and demonstrated:  Sinus arrhythmia with right bundle branch block and left anterior fascicular block, increased voltage.  Recent Labwork:  October 2021: BUN 15, creatinine 1.0, potassium 4.7, AST 20, ALT 16, cholesterol 124,  triglycerides 83, HDL 37, LDL 71, TSH 6.57, hemoglobin 14.1, platelets 271  Other Studies Reviewed Today:  Lexiscan Myoview 08/29/2016: There was no ST segment deviation noted during stress. Findings consistent with prior inferior myocardial infarction without peri-infarct ischemia. This is a low risk study. There is no significant myocardium currently at jeopardy. The left ventricular ejection fraction is normal (55-65%).  Assessment and Plan:    Medication Adjustments/Labs and Tests Ordered: Current medicines are reviewed at length with the patient today.  Concerns regarding medicines are outlined above.   Tests Ordered: No orders of the defined types were placed in this encounter.   Medication Changes: No orders of the defined types were placed in this encounter.   Disposition:  Follow up {follow up:15908}  Signed, Satira Sark, MD, [facility] 12/31/2021 8:02 AM    [facility] at [place], [place], [place] [address] Phone: [phone]; Fax: [phone]

## 2022-01-03 DIAGNOSIS — C44722 Squamous cell carcinoma of skin of right lower limb, including hip: Secondary | ICD-10-CM | POA: Diagnosis not present

## 2022-01-03 DIAGNOSIS — C44622 Squamous cell carcinoma of skin of right upper limb, including shoulder: Secondary | ICD-10-CM | POA: Diagnosis not present

## 2022-01-17 DIAGNOSIS — C4442 Squamous cell carcinoma of skin of scalp and neck: Secondary | ICD-10-CM | POA: Diagnosis not present

## 2022-01-17 DIAGNOSIS — C44329 Squamous cell carcinoma of skin of other parts of face: Secondary | ICD-10-CM | POA: Diagnosis not present

## 2022-01-17 DIAGNOSIS — C44629 Squamous cell carcinoma of skin of left upper limb, including shoulder: Secondary | ICD-10-CM | POA: Diagnosis not present

## 2022-02-01 DIAGNOSIS — E782 Mixed hyperlipidemia: Secondary | ICD-10-CM | POA: Diagnosis not present

## 2022-02-01 DIAGNOSIS — I10 Essential (primary) hypertension: Secondary | ICD-10-CM | POA: Diagnosis not present

## 2022-02-01 DIAGNOSIS — N183 Chronic kidney disease, stage 3 unspecified: Secondary | ICD-10-CM | POA: Diagnosis not present

## 2022-02-01 DIAGNOSIS — R5383 Other fatigue: Secondary | ICD-10-CM | POA: Diagnosis not present

## 2022-02-01 DIAGNOSIS — E7849 Other hyperlipidemia: Secondary | ICD-10-CM | POA: Diagnosis not present

## 2022-02-01 DIAGNOSIS — K2100 Gastro-esophageal reflux disease with esophagitis, without bleeding: Secondary | ICD-10-CM | POA: Diagnosis not present

## 2022-02-01 DIAGNOSIS — Z1329 Encounter for screening for other suspected endocrine disorder: Secondary | ICD-10-CM | POA: Diagnosis not present

## 2022-02-02 ENCOUNTER — Other Ambulatory Visit: Payer: Self-pay | Admitting: Cardiology

## 2022-02-05 DIAGNOSIS — I10 Essential (primary) hypertension: Secondary | ICD-10-CM | POA: Diagnosis not present

## 2022-02-05 DIAGNOSIS — I25119 Atherosclerotic heart disease of native coronary artery with unspecified angina pectoris: Secondary | ICD-10-CM | POA: Diagnosis not present

## 2022-02-05 DIAGNOSIS — D692 Other nonthrombocytopenic purpura: Secondary | ICD-10-CM | POA: Diagnosis not present

## 2022-02-05 DIAGNOSIS — N281 Cyst of kidney, acquired: Secondary | ICD-10-CM | POA: Diagnosis not present

## 2022-02-05 DIAGNOSIS — E7849 Other hyperlipidemia: Secondary | ICD-10-CM | POA: Diagnosis not present

## 2022-02-05 DIAGNOSIS — Q446 Cystic disease of liver: Secondary | ICD-10-CM | POA: Diagnosis not present

## 2022-02-05 DIAGNOSIS — L570 Actinic keratosis: Secondary | ICD-10-CM | POA: Diagnosis not present

## 2022-02-26 DIAGNOSIS — N183 Chronic kidney disease, stage 3 unspecified: Secondary | ICD-10-CM | POA: Diagnosis not present

## 2022-03-06 ENCOUNTER — Encounter: Payer: Self-pay | Admitting: Cardiology

## 2022-03-06 DIAGNOSIS — N189 Chronic kidney disease, unspecified: Secondary | ICD-10-CM | POA: Diagnosis not present

## 2022-03-06 DIAGNOSIS — K2100 Gastro-esophageal reflux disease with esophagitis, without bleeding: Secondary | ICD-10-CM | POA: Diagnosis not present

## 2022-03-06 DIAGNOSIS — N183 Chronic kidney disease, stage 3 unspecified: Secondary | ICD-10-CM | POA: Diagnosis not present

## 2022-03-06 DIAGNOSIS — I10 Essential (primary) hypertension: Secondary | ICD-10-CM | POA: Diagnosis not present

## 2022-03-20 ENCOUNTER — Encounter: Payer: Self-pay | Admitting: Cardiology

## 2022-03-20 ENCOUNTER — Ambulatory Visit: Payer: Medicare Other | Admitting: Cardiology

## 2022-03-20 VITALS — BP 134/80 | HR 63 | Ht 70.0 in | Wt 171.4 lb

## 2022-03-20 DIAGNOSIS — I25119 Atherosclerotic heart disease of native coronary artery with unspecified angina pectoris: Secondary | ICD-10-CM | POA: Diagnosis not present

## 2022-03-20 DIAGNOSIS — I10 Essential (primary) hypertension: Secondary | ICD-10-CM

## 2022-03-20 DIAGNOSIS — E782 Mixed hyperlipidemia: Secondary | ICD-10-CM | POA: Diagnosis not present

## 2022-03-20 NOTE — Progress Notes (Signed)
? ? ?Cardiology Office Note ? ?Date: 03/20/2022  ? ?ID: BECKET WECKER, DOB 06/05/1941, MRN [id number] ? ?PCP:  Caryl Bis, MD  ?Cardiologist:  Rozann Lesches, MD ?Electrophysiologist:  None  ? ?Chief Complaint  ?Patient presents with  ? Cardiac follow-up  ? ? ?History of Present Illness: ?David Harmon is a 81 y.o. adult last seen in May 2022 by Mr. Leonides Sake NP.  He is here for a routine visit.  Doing very well, enjoying retirement, working outdoors.  He does not describe any angina.  NYHA class II dyspnea, no palpitations or syncope. ? ?I reviewed his medications which are noted below.  He has been on aspirin and Plavix long-term, no bleeding problems, and prefers to stay on this.  I personally reviewed his ECG today which shows sinus rhythm with right bundle branch block and left anterior fascicular block which are old. ? ?We are requesting his most recent lab work from Dr. Quillian Quince. ? ?Past Medical History:  ?Diagnosis Date  ? Arthritis   ? Coronary atherosclerosis of native coronary artery   ? DES RCA 11/09 (Dr. Terrence Dupont)  ? Essential hypertension   ? History of kidney stones   ? Mixed hyperlipidemia   ? Myocardial infarction [facility])   ? NSTEMI 11/09  ? ? ?Past Surgical History:  ?Procedure Laterality Date  ? CARDIAC CATHETERIZATION    ? with stent  ? CATARACT EXTRACTION W/PHACO Right 07/28/2017  ? Procedure: CATARACT EXTRACTION PHACO AND INTRAOCULAR LENS PLACEMENT RIGHT EYE;  Surgeon: Tonny Branch, MD;  Location: [facility];  Service: Ophthalmology;  Laterality: Right;  CDE: 8.33  ? EXPLORATORY LAPAROTOMY  2007  ? Small bowel obstruction  ? Resection of pilonidal cyst    ? ? ?Current Outpatient Medications  ?Medication Sig Dispense Refill  ? acetaminophen (TYLENOL) 500 MG tablet Take 1,000 mg by mouth every 6 (six) hours as needed for mild pain or moderate pain.    ? aspirin EC 81 MG tablet Take 81 mg by mouth daily.    ? atorvastatin (LIPITOR) 40 MG tablet Take 40 mg by mouth daily.    ? clopidogrel (PLAVIX) 75 MG  tablet TAKE 1 TABLET(75 MG) BY MOUTH DAILY 90 tablet 1  ? lisinopril-hydrochlorothiazide (PRINZIDE,ZESTORETIC) 20-12.5 MG per tablet Take 1 tablet by mouth daily.     ? Magnesium 500 MG TABS Take 1 tablet by mouth daily.    ? metoprolol succinate (TOPROL-XL) 25 MG 24 hr tablet TAKE 1 TABLET(25 MG) BY MOUTH DAILY 90 tablet 3  ? Multiple Vitamins-Minerals (DAILY MENS HEALTH FORMULA PO) Take 1 tablet by mouth daily.    ? pantoprazole (PROTONIX) 40 MG tablet Take 1 tablet (40 mg total) by mouth daily. (Patient taking differently: Take 40 mg by mouth daily as needed.) 30 tablet 1  ? spironolactone (ALDACTONE) 25 MG tablet Take 25 mg by mouth daily.    ? ?No current facility-administered medications for this visit.  ? ?Allergies:  Patient has no known allergies.  ? ?ROS: No orthopnea or PND. ? ?Physical Exam: ?VS:  BP 134/80   Pulse 63   Ht '5\' 10"'$  (1.778 m)   Wt 171 lb 6.4 oz (77.7 kg)   SpO2 98%   BMI 24.59 kg/m? , BMI Body mass index is 24.59 kg/m?. ? ?Wt Readings from Last 3 Encounters:  ?03/20/22 171 lb 6.4 oz (77.7 kg)  ?05/11/21 181 lb 3.2 oz (82.2 kg)  ?10/31/20 178 lb (80.7 kg)  ?  ?General: Patient appears comfortable at rest. ?  HEENT: Conjunctiva and lids normal. ?Neck: Supple, no elevated JVP or carotid bruits, no thyromegaly. ?Lungs: Clear to auscultation, nonlabored breathing at rest. ?Cardiac: Regular rate and rhythm, no S3 or significant systolic murmur, no pericardial rub. ?Extremities: No pitting edema. ? ?ECG:  An ECG dated 05/11/2021 was personally reviewed today and demonstrated:  Sinus rhythm Mia with right bundle branch block and left anterior fascicular block. ? ?Recent Labwork: ? ?October 2021: BUN 15, creatinine 1.02, potassium 4.7, AST 20, ALT 16, cholesterol 124, triglycerides 83, HDL 37, LDL 71, TSH 8.57, hemoglobin 14.1, platelets 271 ? ?Other Studies Reviewed Today: ? ?Lexiscan Myoview 08/29/2016: ?There was no ST segment deviation noted during stress. ?Findings consistent with prior  inferior myocardial infarction without peri-infarct ischemia. ?This is a low risk study. There is no significant myocardium currently at jeopardy. ?The left ventricular ejection fraction is normal (55-65%). ? ?Assessment and Plan: ? ?1.  CAD status post DES to the RCA in 2009.  He continues to do well, no angina symptoms on medical therapy and good stamina.  ECG reviewed and stable.  Continue aspirin, Plavix, Toprol-XL, Prinzide, Aldactone, and Lipitor. ? ?2.  Essential hypertension, blood pressure control is adequate today.  No changes were made. ? ?3.  Mixed hyperlipidemia on Lipitor.  Requesting recent lab work from Dr. Quillian Quince. ? ?Medication Adjustments/Labs and Tests Ordered: ?Current medicines are reviewed at length with the patient today.  Concerns regarding medicines are outlined above.  ? ?Tests Ordered: ?Orders Placed This Encounter  ?Procedures  ? EKG 12-Lead  ? ? ?Medication Changes: ?No orders of the defined types were placed in this encounter. ? ? ?Disposition:  Follow up  1 year. ? ?Signed, ?Satira Sark, MD, [facility] ?03/20/2022 4:03 PM    ?[place] [facility] ?[place], [place], [place] [address] ?Phone: [phone]; Fax: [phone]  ?

## 2022-03-20 NOTE — Patient Instructions (Signed)
Follow-Up: ?Follow up with Dr. Domenic Polite in 1 year.  ? ?Any Other Special Instructions Will Be Listed Below (If Applicable). ? ? ? ? ?If you need a refill on your cardiac medications before your next appointment, please call your pharmacy. ? ?

## 2022-03-21 DIAGNOSIS — I10 Essential (primary) hypertension: Secondary | ICD-10-CM | POA: Diagnosis not present

## 2022-03-21 DIAGNOSIS — R109 Unspecified abdominal pain: Secondary | ICD-10-CM | POA: Diagnosis not present

## 2022-03-28 DIAGNOSIS — R109 Unspecified abdominal pain: Secondary | ICD-10-CM | POA: Diagnosis not present

## 2022-05-29 DIAGNOSIS — Z7982 Long term (current) use of aspirin: Secondary | ICD-10-CM

## 2022-05-29 DIAGNOSIS — Z955 Presence of coronary angioplasty implant and graft: Secondary | ICD-10-CM | POA: Diagnosis not present

## 2022-05-29 DIAGNOSIS — Z79899 Other long term (current) drug therapy: Secondary | ICD-10-CM | POA: Diagnosis not present

## 2022-05-29 DIAGNOSIS — I10 Essential (primary) hypertension: Secondary | ICD-10-CM | POA: Diagnosis present

## 2022-05-29 DIAGNOSIS — Z961 Presence of intraocular lens: Secondary | ICD-10-CM | POA: Diagnosis present

## 2022-05-29 DIAGNOSIS — Z833 Family history of diabetes mellitus: Secondary | ICD-10-CM

## 2022-05-29 DIAGNOSIS — Z87442 Personal history of urinary calculi: Secondary | ICD-10-CM

## 2022-05-29 DIAGNOSIS — E782 Mixed hyperlipidemia: Secondary | ICD-10-CM | POA: Diagnosis not present

## 2022-05-29 DIAGNOSIS — Z8249 Family history of ischemic heart disease and other diseases of the circulatory system: Secondary | ICD-10-CM | POA: Diagnosis not present

## 2022-05-29 DIAGNOSIS — Z87891 Personal history of nicotine dependence: Secondary | ICD-10-CM

## 2022-05-29 DIAGNOSIS — I2510 Atherosclerotic heart disease of native coronary artery without angina pectoris: Secondary | ICD-10-CM | POA: Diagnosis not present

## 2022-05-29 DIAGNOSIS — Z9841 Cataract extraction status, right eye: Secondary | ICD-10-CM | POA: Diagnosis not present

## 2022-05-29 DIAGNOSIS — K56600 Partial intestinal obstruction, unspecified as to cause: Secondary | ICD-10-CM | POA: Diagnosis not present

## 2022-05-29 DIAGNOSIS — K56609 Unspecified intestinal obstruction, unspecified as to partial versus complete obstruction: Secondary | ICD-10-CM | POA: Diagnosis not present

## 2022-05-29 DIAGNOSIS — K5939 Other megacolon: Secondary | ICD-10-CM | POA: Diagnosis not present

## 2022-05-29 DIAGNOSIS — Z7902 Long term (current) use of antithrombotics/antiplatelets: Secondary | ICD-10-CM

## 2022-05-29 DIAGNOSIS — K219 Gastro-esophageal reflux disease without esophagitis: Secondary | ICD-10-CM | POA: Diagnosis present

## 2022-05-29 DIAGNOSIS — I252 Old myocardial infarction: Secondary | ICD-10-CM | POA: Diagnosis not present

## 2022-05-29 DIAGNOSIS — K6389 Other specified diseases of intestine: Secondary | ICD-10-CM | POA: Diagnosis not present

## 2022-05-29 DIAGNOSIS — K529 Noninfective gastroenteritis and colitis, unspecified: Secondary | ICD-10-CM | POA: Diagnosis present

## 2022-05-29 DIAGNOSIS — K4090 Unilateral inguinal hernia, without obstruction or gangrene, not specified as recurrent: Secondary | ICD-10-CM | POA: Diagnosis not present

## 2022-05-29 DIAGNOSIS — K5730 Diverticulosis of large intestine without perforation or abscess without bleeding: Secondary | ICD-10-CM | POA: Diagnosis not present

## 2022-05-30 ENCOUNTER — Inpatient Hospital Stay (HOSPITAL_COMMUNITY): Payer: Medicare Other

## 2022-05-30 ENCOUNTER — Emergency Department (HOSPITAL_COMMUNITY): Payer: Medicare Other

## 2022-05-30 ENCOUNTER — Other Ambulatory Visit: Payer: Self-pay

## 2022-05-30 ENCOUNTER — Inpatient Hospital Stay (HOSPITAL_COMMUNITY)
Admission: EM | Admit: 2022-05-30 | Discharge: 2022-06-01 | DRG: 390 | Disposition: A | Discharge status: Home / Self Care | Payer: Medicare Other | Attending: Internal Medicine | Admitting: Internal Medicine

## 2022-05-30 ENCOUNTER — Encounter (HOSPITAL_COMMUNITY): Payer: Self-pay | Admitting: Emergency Medicine

## 2022-05-30 DIAGNOSIS — Z9841 Cataract extraction status, right eye: Secondary | ICD-10-CM | POA: Diagnosis not present

## 2022-05-30 DIAGNOSIS — I10 Essential (primary) hypertension: Secondary | ICD-10-CM | POA: Diagnosis not present

## 2022-05-30 DIAGNOSIS — I214 Non-ST elevation (NSTEMI) myocardial infarction: Secondary | ICD-10-CM | POA: Diagnosis not present

## 2022-05-30 DIAGNOSIS — Z87891 Personal history of nicotine dependence: Secondary | ICD-10-CM | POA: Diagnosis not present

## 2022-05-30 DIAGNOSIS — K4090 Unilateral inguinal hernia, without obstruction or gangrene, not specified as recurrent: Secondary | ICD-10-CM | POA: Diagnosis not present

## 2022-05-30 DIAGNOSIS — E782 Mixed hyperlipidemia: Secondary | ICD-10-CM | POA: Diagnosis not present

## 2022-05-30 DIAGNOSIS — I2510 Atherosclerotic heart disease of native coronary artery without angina pectoris: Secondary | ICD-10-CM | POA: Diagnosis not present

## 2022-05-30 DIAGNOSIS — K56609 Unspecified intestinal obstruction, unspecified as to partial versus complete obstruction: Secondary | ICD-10-CM | POA: Diagnosis not present

## 2022-05-30 DIAGNOSIS — Z4682 Encounter for fitting and adjustment of non-vascular catheter: Secondary | ICD-10-CM | POA: Diagnosis not present

## 2022-05-30 DIAGNOSIS — Z961 Presence of intraocular lens: Secondary | ICD-10-CM | POA: Diagnosis present

## 2022-05-30 DIAGNOSIS — K56600 Partial intestinal obstruction, unspecified as to cause: Secondary | ICD-10-CM | POA: Diagnosis present

## 2022-05-30 DIAGNOSIS — Z833 Family history of diabetes mellitus: Secondary | ICD-10-CM | POA: Diagnosis not present

## 2022-05-30 DIAGNOSIS — K6389 Other specified diseases of intestine: Secondary | ICD-10-CM | POA: Diagnosis not present

## 2022-05-30 DIAGNOSIS — Z87442 Personal history of urinary calculi: Secondary | ICD-10-CM | POA: Diagnosis not present

## 2022-05-30 DIAGNOSIS — K529 Noninfective gastroenteritis and colitis, unspecified: Secondary | ICD-10-CM | POA: Diagnosis present

## 2022-05-30 DIAGNOSIS — Z7982 Long term (current) use of aspirin: Secondary | ICD-10-CM | POA: Diagnosis not present

## 2022-05-30 DIAGNOSIS — R112 Nausea with vomiting, unspecified: Secondary | ICD-10-CM | POA: Diagnosis not present

## 2022-05-30 DIAGNOSIS — Z955 Presence of coronary angioplasty implant and graft: Secondary | ICD-10-CM | POA: Diagnosis not present

## 2022-05-30 DIAGNOSIS — Z79899 Other long term (current) drug therapy: Secondary | ICD-10-CM | POA: Diagnosis not present

## 2022-05-30 DIAGNOSIS — Z7902 Long term (current) use of antithrombotics/antiplatelets: Secondary | ICD-10-CM | POA: Diagnosis not present

## 2022-05-30 DIAGNOSIS — K219 Gastro-esophageal reflux disease without esophagitis: Secondary | ICD-10-CM | POA: Diagnosis not present

## 2022-05-30 DIAGNOSIS — K5939 Other megacolon: Secondary | ICD-10-CM | POA: Diagnosis not present

## 2022-05-30 DIAGNOSIS — R109 Unspecified abdominal pain: Secondary | ICD-10-CM

## 2022-05-30 DIAGNOSIS — K5730 Diverticulosis of large intestine without perforation or abscess without bleeding: Secondary | ICD-10-CM | POA: Diagnosis not present

## 2022-05-30 DIAGNOSIS — Z8249 Family history of ischemic heart disease and other diseases of the circulatory system: Secondary | ICD-10-CM | POA: Diagnosis not present

## 2022-05-30 DIAGNOSIS — I252 Old myocardial infarction: Secondary | ICD-10-CM | POA: Diagnosis not present

## 2022-05-30 LAB — URINALYSIS, ROUTINE W REFLEX MICROSCOPIC
Bilirubin Urine: NEGATIVE
Glucose, UA: NEGATIVE mg/dL
Hgb urine dipstick: NEGATIVE
Ketones, ur: NEGATIVE mg/dL
Leukocytes,Ua: NEGATIVE
Nitrite: NEGATIVE
Protein, ur: NEGATIVE mg/dL
Specific Gravity, Urine: 1.018 (ref 1.005–1.030)
pH: 6 (ref 5.0–8.0)

## 2022-05-30 LAB — COMPREHENSIVE METABOLIC PANEL
ALT: 16 U/L (ref 0–44)
AST: 23 U/L (ref 15–41)
Albumin: 4.5 g/dL (ref 3.5–5.0)
Alkaline Phosphatase: 50 U/L (ref 38–126)
Anion gap: 8 (ref 5–15)
BUN: 14 mg/dL (ref 8–23)
CO2: 27 mmol/L (ref 22–32)
Calcium: 9.3 mg/dL (ref 8.9–10.3)
Chloride: 104 mmol/L (ref 98–111)
Creatinine, Ser: 0.97 mg/dL (ref 0.61–1.24)
GFR, Estimated: >60 mL/min (ref >60)
Glucose, Bld: 121 mg/dL — ABNORMAL HIGH (ref 70–99)
Potassium: 3.9 mmol/L (ref 3.5–5.1)
Sodium: 139 mmol/L (ref 135–145)
Total Bilirubin: 0.7 mg/dL (ref 0.3–1.2)
Total Protein: 7.6 g/dL (ref 6.5–8.1)

## 2022-05-30 LAB — CBC WITH DIFFERENTIAL/PLATELET
Abs Immature Granulocytes: 0.04 10*3/uL (ref 0.00–0.07)
Basophils Absolute: 0 10*3/uL (ref 0.0–0.1)
Basophils Relative: 0 %
Eosinophils Absolute: 0.2 10*3/uL (ref 0.0–0.5)
Eosinophils Relative: 3 %
HCT: 47.9 % (ref 39.0–52.0)
Hemoglobin: 16 g/dL (ref 13.0–17.0)
Immature Granulocytes: 0 %
Lymphocytes Relative: 17 %
Lymphs Abs: 1.6 10*3/uL (ref 0.7–4.0)
MCH: 30.3 pg (ref 26.0–34.0)
MCHC: 33.4 g/dL (ref 30.0–36.0)
MCV: 90.7 fL (ref 80.0–100.0)
Monocytes Absolute: 0.7 10*3/uL (ref 0.1–1.0)
Monocytes Relative: 8 %
Neutro Abs: 6.6 10*3/uL (ref 1.7–7.7)
Neutrophils Relative %: 72 %
Platelets: 278 10*3/uL (ref 150–400)
RBC: 5.28 MIL/uL (ref 4.22–5.81)
RDW: 13 % (ref 11.5–15.5)
WBC: 9.2 10*3/uL (ref 4.0–10.5)
nRBC: 0 % (ref 0.0–0.2)

## 2022-05-30 LAB — MAGNESIUM: Magnesium: 2.2 mg/dL (ref 1.7–2.4)

## 2022-05-30 LAB — PHOSPHORUS: Phosphorus: 2.5 mg/dL (ref 2.5–4.6)

## 2022-05-30 LAB — LIPASE, BLOOD: Lipase: 25 U/L (ref 11–51)

## 2022-05-30 LAB — MRSA NEXT GEN BY PCR, NASAL: MRSA by PCR Next Gen: NOT DETECTED

## 2022-05-30 MED ORDER — LACTATED RINGERS IV SOLN: INTRAVENOUS | Status: DC

## 2022-05-30 MED ORDER — MENTHOL 3 MG MT LOZG
1.0000 | LOZENGE | OROMUCOSAL | Status: DC | PRN
  Filled 2022-05-30: qty 9

## 2022-05-30 MED ORDER — FENTANYL CITRATE PF 50 MCG/ML IJ SOSY
50.0000 ug | PREFILLED_SYRINGE | Freq: Once | INTRAMUSCULAR | Status: AC
  Administered 2022-05-30: 50 ug via INTRAVENOUS
  Filled 2022-05-30: qty 1

## 2022-05-30 MED ORDER — MORPHINE SULFATE (PF) 4 MG/ML IV SOLN
4.0000 mg | Freq: Once | INTRAVENOUS | Status: AC
  Administered 2022-05-30: 4 mg via INTRAVENOUS
  Filled 2022-05-30: qty 1

## 2022-05-30 MED ORDER — BENZOCAINE 20 % MT AERO
INHALATION_SPRAY | Freq: Once | OROMUCOSAL | Status: AC
  Administered 2022-05-30: 1 via OROMUCOSAL
  Filled 2022-05-30: qty 57

## 2022-05-30 MED ORDER — PROCHLORPERAZINE EDISYLATE 10 MG/2ML IJ SOLN
INTRAMUSCULAR | Status: AC
  Filled 2022-05-30: qty 2

## 2022-05-30 MED ORDER — ONDANSETRON HCL 4 MG/2ML IJ SOLN
INTRAMUSCULAR | Status: AC
  Administered 2022-05-30: 4 mg via INTRAVENOUS
  Filled 2022-05-30: qty 2

## 2022-05-30 MED ORDER — HYDRALAZINE HCL 20 MG/ML IJ SOLN
10.0000 mg | Freq: Four times a day (QID) | INTRAMUSCULAR | Status: DC | PRN
  Administered 2022-05-31: 10 mg via INTRAVENOUS
  Filled 2022-05-30: qty 1

## 2022-05-30 MED ORDER — PROCHLORPERAZINE MALEATE 10 MG PO TABS: 10.0000 mg | ORAL_TABLET | Freq: Four times a day (QID) | ORAL | Status: DC | PRN

## 2022-05-30 MED ORDER — PANTOPRAZOLE SODIUM 40 MG IV SOLR
40.0000 mg | INTRAVENOUS | Status: DC
  Administered 2022-05-30 – 2022-06-01 (×3): 40 mg via INTRAVENOUS
  Filled 2022-05-30 (×3): qty 10

## 2022-05-30 MED ORDER — MORPHINE SULFATE (PF) 2 MG/ML IV SOLN
2.0000 mg | INTRAVENOUS | Status: DC | PRN
  Administered 2022-05-30 (×2): 2 mg via INTRAVENOUS
  Filled 2022-05-30 (×2): qty 1

## 2022-05-30 MED ORDER — ONDANSETRON HCL 4 MG/2ML IJ SOLN
4.0000 mg | Freq: Once | INTRAMUSCULAR | Status: AC
  Administered 2022-05-30: 4 mg via INTRAVENOUS
  Filled 2022-05-30: qty 2

## 2022-05-30 MED ORDER — IOHEXOL 300 MG/ML  SOLN
100.0000 mL | Freq: Once | INTRAMUSCULAR | Status: AC | PRN
  Administered 2022-05-30: 100 mL via INTRAVENOUS

## 2022-05-30 MED ORDER — PROCHLORPERAZINE EDISYLATE 10 MG/2ML IJ SOLN
10.0000 mg | Freq: Four times a day (QID) | INTRAMUSCULAR | Status: DC | PRN
  Administered 2022-05-30 (×2): 10 mg via INTRAVENOUS
  Filled 2022-05-30: qty 2

## 2022-05-30 MED ORDER — CHLORHEXIDINE GLUCONATE CLOTH 2 % EX PADS
6.0000 | MEDICATED_PAD | Freq: Every day | CUTANEOUS | Status: DC
  Administered 2022-05-30 – 2022-06-01 (×3): 6 via TOPICAL

## 2022-05-30 MED ORDER — ONDANSETRON HCL 4 MG/2ML IJ SOLN
4.0000 mg | Freq: Once | INTRAMUSCULAR | Status: AC
  Administered 2022-05-30: 4 mg via INTRAVENOUS

## 2022-05-30 MED ORDER — ONDANSETRON HCL 4 MG/2ML IJ SOLN: 4.0000 mg | Freq: Four times a day (QID) | INTRAMUSCULAR | Status: DC | PRN

## 2022-05-30 NOTE — Consult Note (Signed)
Reason for Consult: Small bowel obstruction Referring Physician: Dr. Sebastian Ache is an 81 y.o. male.  HPI: Patient is a 81 year old white male who presented to the emergency room with worsening abdominal pain.  It started yesterday evening after dinner.  He had an episode of emesis when he presented to the emergency room.  He denies any fever, chills, diarrhea, or constipation.  His last bowel movement was yesterday.  He was found on CT scan of the abdomen to have 2 areas of small bowel loops in the left abdomen which were concerning for enteritis with resultant small bowel obstruction.  He was admitted to the hospital for further evaluation and treatment.  Since his admission, his abdominal pain has eased.  He has not had a bowel movement or passed flatus since his admission.  He had an episode of a small bowel obstruction that required exploratory laparotomy in the remote past at [facility].  Patient does not know of any bowel was resected.  His past medical history is significant for coronary artery disease, hypertension.  He is currently on Plavix.  Past Medical History:  Diagnosis Date   Arthritis    Coronary atherosclerosis of native coronary artery    DES RCA 11/09 (Dr. Terrence Dupont)   Essential hypertension    History of kidney stones    Mixed hyperlipidemia    Myocardial infarction [facility])    NSTEMI 11/09    Past Surgical History:  Procedure Laterality Date   CARDIAC CATHETERIZATION     with stent   CATARACT EXTRACTION W/PHACO Right 07/28/2017   Procedure: CATARACT EXTRACTION PHACO AND INTRAOCULAR LENS PLACEMENT RIGHT EYE;  Surgeon: Tonny Branch, MD;  Location: [facility];  Service: Ophthalmology;  Laterality: Right;  CDE: 8.33   EXPLORATORY LAPAROTOMY  2007   Small bowel obstruction   Resection of pilonidal cyst      Family History  Problem Relation Age of Onset   Coronary artery disease Father    Coronary artery disease Mother    Diabetes type II Sister     Social  History:  reports that he quit smoking about 56 years ago. His smoking use included cigarettes. He has a 5.00 pack-year smoking history. He has never used smokeless tobacco. He reports that he does not drink alcohol and does not use drugs.  Allergies: No Known Allergies  Medications: I have reviewed the patient's current medications. Prior to Admission:  Medications Prior to Admission  Medication Sig Dispense Refill Last Dose   acetaminophen (TYLENOL) 500 MG tablet Take 1,000 mg by mouth every 6 (six) hours as needed for mild pain or moderate pain.   UNK   aspirin EC 81 MG tablet Take 81 mg by mouth daily.   05/28/2022   atorvastatin (LIPITOR) 40 MG tablet Take 40 mg by mouth daily.   05/28/2022   clopidogrel (PLAVIX) 75 MG tablet TAKE 1 TABLET(75 MG) BY MOUTH DAILY (Patient taking differently: Take 75 mg by mouth daily.) 90 tablet 1 05/29/2022 at 0900   dicyclomine (BENTYL) 10 MG capsule Take 10 mg by mouth 2 (two) times daily.   05/29/2022   lisinopril-hydrochlorothiazide (PRINZIDE,ZESTORETIC) 20-12.5 MG per tablet Take 1 tablet by mouth daily.    05/29/2022   Magnesium 500 MG TABS Take 1 tablet by mouth daily.   05/29/2022   metoprolol succinate (TOPROL-XL) 25 MG 24 hr tablet TAKE 1 TABLET(25 MG) BY MOUTH DAILY (Patient taking differently: Take 25 mg by mouth daily.) 90 tablet 3 05/29/2022 at 0900  Multiple Vitamins-Minerals (DAILY MENS HEALTH FORMULA PO) Take 1 tablet by mouth daily.   05/29/2022   pantoprazole (PROTONIX) 40 MG tablet Take 1 tablet (40 mg total) by mouth daily. (Patient taking differently: Take 40 mg by mouth daily as needed.) 30 tablet 1 Past Week   spironolactone (ALDACTONE) 25 MG tablet Take 25 mg by mouth daily.   05/29/2022    Results for orders placed or performed during the hospital encounter of 05/30/22 (from the past 48 hour(s))  Comprehensive metabolic panel     Status: Abnormal   Collection Time: 05/30/22 12:49 AM  Result Value Ref Range   Sodium 139 135 - 145 mmol/L    Potassium 3.9 3.5 - 5.1 mmol/L   Chloride 104 98 - 111 mmol/L   CO2 27 22 - 32 mmol/L   Glucose, Bld 121 (H) 70 - 99 mg/dL    Comment: Glucose reference range applies only to samples taken after fasting for at least 8 hours.   BUN 14 8 - 23 mg/dL   Creatinine, Ser 0.97 0.61 - 1.24 mg/dL   Calcium 9.3 8.9 - 10.3 mg/dL   Total Protein 7.6 6.5 - 8.1 g/dL   Albumin 4.5 3.5 - 5.0 g/dL   AST 23 15 - 41 U/L   ALT 16 0 - 44 U/L   Alkaline Phosphatase 50 38 - 126 U/L   Total Bilirubin 0.7 0.3 - 1.2 mg/dL   GFR, Estimated >60 >60 mL/min    Comment: (NOTE) Calculated using the CKD-EPI Creatinine Equation (2021)    Anion gap 8 5 - 15    Comment: Performed at [facility], [address] [place], [place] [address]  CBC with Differential     Status: None   Collection Time: 05/30/22 12:49 AM  Result Value Ref Range   WBC 9.2 4.0 - 10.5 K/uL   RBC 5.28 4.22 - 5.81 MIL/uL   Hemoglobin 16.0 13.0 - 17.0 g/dL   HCT 47.9 39.0 - 52.0 %   MCV 90.7 80.0 - 100.0 fL   MCH 30.3 26.0 - 34.0 pg   MCHC 33.4 30.0 - 36.0 g/dL   RDW 13.0 11.5 - 15.5 %   Platelets 278 150 - 400 K/uL   nRBC 0.0 0.0 - 0.2 %   Neutrophils Relative % 72 %   Neutro Abs 6.6 1.7 - 7.7 K/uL   Lymphocytes Relative 17 %   Lymphs Abs 1.6 0.7 - 4.0 K/uL   Monocytes Relative 8 %   Monocytes Absolute 0.7 0.1 - 1.0 K/uL   Eosinophils Relative 3 %   Eosinophils Absolute 0.2 0.0 - 0.5 K/uL   Basophils Relative 0 %   Basophils Absolute 0.0 0.0 - 0.1 K/uL   Immature Granulocytes 0 %   Abs Immature Granulocytes 0.04 0.00 - 0.07 K/uL    Comment: Performed at [facility], [address], [place], [place] [address]  Lipase, blood     Status: None   Collection Time: 05/30/22 12:49 AM  Result Value Ref Range   Lipase 25 11 - 51 U/L    Comment: Performed at [facility], [address] [place], [place] [address]  Magnesium     Status: None   Collection Time: 05/30/22 12:49 AM  Result Value Ref Range   Magnesium 2.2 1.7 - 2.4 mg/dL     Comment: Performed at [facility], [address], [place],  [address]  Phosphorus     Status: None   Collection Time: 05/30/22 12:49 AM  Result Value Ref Range   Phosphorus 2.5 2.5 - 4.6 mg/dL    Comment: Performed at [facility], [address], [place], [place] [address]  Urinalysis, Routine w reflex microscopic Urine, Clean Catch     Status: None   Collection Time: 05/30/22  3:59 AM  Result Value Ref Range   Color, Urine YELLOW YELLOW   APPearance CLEAR CLEAR   Specific Gravity, Urine 1.018 1.005 - 1.030   pH 6.0 5.0 - 8.0   Glucose, UA NEGATIVE NEGATIVE mg/dL   Hgb urine dipstick NEGATIVE NEGATIVE   Bilirubin Urine NEGATIVE NEGATIVE   Ketones, ur NEGATIVE NEGATIVE mg/dL   Protein, ur NEGATIVE NEGATIVE mg/dL   Nitrite NEGATIVE NEGATIVE   Leukocytes,Ua NEGATIVE NEGATIVE    Comment: Performed at [facility], [address] [place], [place] [address]    DG Chest Portable 1 View  Result Date: 05/30/2022 CLINICAL DATA:  81 year old male status post nasogastric tube placement. EXAM: PORTABLE CHEST 1 VIEW COMPARISON:  Chest x-ray 05/30/2022. FINDINGS: Nasogastric tube again noted, with tip in the distal aspect of the body of the stomach. Lung volumes are low. Bibasilar opacities (left-greater-than-right), favored to predominantly reflect areas of subsegmental atelectasis, although underlying airspace consolidation in the left lower lobe is difficult to entirely exclude. No pleural effusions. No pneumothorax. No evidence of pulmonary edema. Heart size is normal. Upper mediastinal contours are within normal limits. Atherosclerotic calcifications are noted in the thoracic aorta. Lung apices are incompletely imaged on this examination. IMPRESSION: 1. Tip of nasogastric tube is in the distal body of the stomach. 2. Low lung volumes with bibasilar (left-greater-than-right) areas of atelectasis and/or consolidation. Electronically Signed   By: Vinnie Langton M.D.   On: 05/30/2022 05:11    DG Chest Portable 1 View  Result Date: 05/30/2022 CLINICAL DATA:  81 year old male status post nasogastric tube placement. EXAM: PORTABLE CHEST 1 VIEW COMPARISON:  Chest x-ray 07/31/2017. FINDINGS: A nasogastric tube is seen extending into the stomach, however, the tip of the nasogastric tube extends below the lower margin of the image. Lung volumes are low. Bibasilar opacities (left-greater-than-right), favored to predominantly reflect areas of subsegmental atelectasis, although underlying airspace consolidation in the left lower lobe is difficult to entirely exclude. No pleural effusions. No pneumothorax. No evidence of pulmonary edema. Heart size is normal. Upper mediastinal contours are within normal limits. Atherosclerotic calcifications are noted in the thoracic aorta. IMPRESSION: 1. Support apparatus, as above. 2. Low lung volumes with bibasilar opacities (left-greater-than-right) which may reflect areas of atelectasis and/or consolidation. Electronically Signed   By: Vinnie Langton M.D.   On: 05/30/2022 05:09   CT Abdomen Pelvis W Contrast  Result Date: 05/30/2022 CLINICAL DATA:  Bowel obstruction suspected.  Abdominal pain EXAM: CT ABDOMEN AND PELVIS WITH CONTRAST TECHNIQUE: Multidetector CT imaging of the abdomen and pelvis was performed using the standard protocol following bolus administration of intravenous contrast. RADIATION DOSE REDUCTION: This exam was performed according to the departmental dose-optimization program which includes automated exposure control, adjustment of the mA and/or kV according to patient size and/or use of iterative reconstruction technique. CONTRAST:  191m OMNIPAQUE IOHEXOL 300 MG/ML  SOLN COMPARISON:  02/22/2014 FINDINGS: Lower chest: Coronary artery and aortic calcifications. No acute abnormality Hepatobiliary: Numerous cysts scattered throughout the liver, the largest in the left hepatic lobe measuring 2.5 cm. These have a benign appearance. Gallbladder  unremarkable. Pancreas: No focal abnormality or ductal dilatation. Spleen: No focal abnormality.  Normal size. Adrenals/Urinary Tract: 2.5 cm cyst in the midpole of  the left kidney. 4.3 cm cyst in the lower pole of the left kidney. These have a benign appearance. No follow-up imaging recommended. No hydronephrosis or stones. Adrenal glands and urinary bladder unremarkable. Stomach/Bowel: Left colonic diverticulosis. No active diverticulitis. Normal appendix. Dilated small bowel loops into the pelvis. Distal small bowel loops are decompressed. Findings compatible with small bowel obstruction. There is a abnormal loop of small bowel in the left abdomen with wall thickening and hyperemia/enhancement suggesting enteritis. This appears to be the cause of the bowel obstruction. Vascular/Lymphatic: No evidence of aneurysm or adenopathy. Reproductive: Prostate enlargement with central calcifications. Other: No free fluid or free air. Small left inguinal hernia containing fat. Large right inguinal hernia containing small bowel loops. This does not appear to be the level of obstruction. Musculoskeletal: No acute bony abnormality. IMPRESSION: Dilated small bowel loops in the abdomen and pelvis continued 2 a thick walled small bowel loop in the left abdomen/pelvis. Findings concerning for infectious or inflammatory enteritis with resulting small bowel obstruction. Large right inguinal hernia containing small bowel loops. This does not appear to be the level of obstruction. Left colonic diverticulosis. Electronically Signed   By: Rolm Baptise M.D.   On: 05/30/2022 02:48    ROS:  Pertinent items are noted in HPI.  Blood pressure 127/80, pulse 78, temperature 98.4 F (36.9 C), temperature source Oral, resp. rate 12, height '5\' 9"'$  (1.753 m), weight 77.1 kg, SpO2 97 %. Physical Exam: Well-developed well-nourished white male no acute distress Head is normocephalic, atraumatic Lungs clear to auscultation with equal breath  sounds bilaterally Heart examination reveals regular rate rhythm without S3, S4, murmurs Abdomen is soft, nontender, nondistended.  No rigidity is noted.  Occasional bowel sounds are present.  CT scan images personally reviewed  Assessment/Plan: Impression: Partial small bowel obstruction with evidence of enteritis.  Clinically, since his domino pain has eased, I doubt he has a closed-loop obstruction with ischemia of the small bowel.  This may be due to adhesive disease from his previous surgery.  No need for acute surgical invention at this time. Plan: Continue NG tube decompression and IV hydration.  Continue to hold Plavix.  Will continue serial abdominal examination.  I told the patient and wife that he may require NG tube decompression for the next several days.  Should it not resolve, a small bowel obstruction protocol will be performed.  Aviva Signs 05/30/2022, 1:21 PM

## 2022-05-30 NOTE — ED Notes (Signed)
ED Provider at bedside. 

## 2022-05-30 NOTE — H&P (Signed)
History and Physical    Patient: David Harmon XIP:[id number] DOB: 28-Jun-1941 DOA: 05/30/2022 DOS: the patient was seen and examined on 05/30/2022 PCP: Caryl Bis, MD  Patient coming from: Home  Chief Complaint:  Chief Complaint  Patient presents with   Abdominal Pain   HPI: David Harmon is a 81 y.o. male with medical history significant of hypertension, hyperlipidemia, CAD s/p stent placement, NSTEMI who presents to the emergency department accompanied by daughter due to generalized abdominal pain which started after having dinner yesterday around 6:30 PM, pain was severe in nature but he had no nausea, vomiting or diarrhea until after arrival to the ED when he had one-time nonbloody vomiting.  He denied fever, chills, chest pain, shortness of breath, headache, blurry vision.  Last bowel movement was yesterday in the morning.   ED Course:  In the emergency department, he was hemodynamically stable, BP was 146/81, all other vital signs were within normal range.  Work-up in the ED showed normal CBC and BMP except for CBG of 121.  Lipase was normal at 25, urinalysis was normal. CT abdomen and pelvis with contrast showed dilated small bowel loops continued 2 a thick walled small bowel loop in the left abdomen/pelvis. Findings concerning for infectious or inflammatory enteritis with resulting small bowel obstruction. He was treated with fentanyl and, morphine, Zofran and IV hydration was provided.  Hospitalist was asked to admit patient for further evaluation and management.  Review of Systems: Review of systems as noted in the HPI. All other systems reviewed and are negative.   Past Medical History:  Diagnosis Date   Arthritis    Coronary atherosclerosis of native coronary artery    DES RCA 11/09 (Dr. Terrence Dupont)   Essential hypertension    History of kidney stones    Mixed hyperlipidemia    Myocardial infarction [facility])    NSTEMI 11/09   Past Surgical History:  Procedure  Laterality Date   CARDIAC CATHETERIZATION     with stent   CATARACT EXTRACTION W/PHACO Right 07/28/2017   Procedure: CATARACT EXTRACTION PHACO AND INTRAOCULAR LENS PLACEMENT RIGHT EYE;  Surgeon: Tonny Branch, MD;  Location: [facility];  Service: Ophthalmology;  Laterality: Right;  CDE: 8.33   EXPLORATORY LAPAROTOMY  2007   Small bowel obstruction   Resection of pilonidal cyst      Social History:  reports that he quit smoking about 56 years ago. His smoking use included cigarettes. He has a 5.00 pack-year smoking history. He has never used smokeless tobacco. He reports that he does not drink alcohol and does not use drugs.   No Known Allergies  Family History  Problem Relation Age of Onset   Coronary artery disease Father    Coronary artery disease Mother    Diabetes type II Sister      Prior to Admission medications   Medication Sig Start Date End Date Taking? Authorizing Provider  acetaminophen (TYLENOL) 500 MG tablet Take 1,000 mg by mouth every 6 (six) hours as needed for mild pain or moderate pain.    [provider]  aspirin EC 81 MG tablet Take 81 mg by mouth daily.    [provider]  atorvastatin (LIPITOR) 40 MG tablet Take 40 mg by mouth daily.    [provider]  clopidogrel (PLAVIX) 75 MG tablet TAKE 1 TABLET(75 MG) BY MOUTH DAILY 02/04/22   Satira Sark, MD  lisinopril-hydrochlorothiazide (PRINZIDE,ZESTORETIC) 20-12.5 MG per tablet Take 1 tablet by mouth daily.  09/14/13  [provider]  Magnesium 500 MG TABS Take 1 tablet by mouth daily.    [provider]  metoprolol succinate (TOPROL-XL) 25 MG 24 hr tablet TAKE 1 TABLET(25 MG) BY MOUTH DAILY 10/29/21   Satira Sark, MD  Multiple Vitamins-Minerals (DAILY MENS HEALTH FORMULA PO) Take 1 tablet by mouth daily.    [provider]  pantoprazole (PROTONIX) 40 MG tablet Take 1 tablet (40 mg total) by mouth daily. Patient taking differently: Take 40 mg by mouth daily  as needed. 08/01/17   Reyne Dumas, MD  spironolactone (ALDACTONE) 25 MG tablet Take 25 mg by mouth daily.    [provider]    Physical Exam: BP (!) 149/84   Pulse 66   Temp 98.6 F (37 C) (Oral)   Resp 14   Ht '5\' 9"'$  (1.753 m)   Wt 77.1 kg   SpO2 95%   BMI 25.10 kg/m   General: 81 y.o. year-old male well developed well nourished in no acute distress.  Alert and oriented x3. HEENT: NCAT, EOMI Neck: Supple, trachea medial Cardiovascular: Regular rate and rhythm with no rubs or gallops.  No thyromegaly or JVD noted.  No lower extremity edema. 2/4 pulses in all 4 extremities. Respiratory: Clear to auscultation with no wheezes or rales. Good inspiratory effort. Abdomen: Soft, tender to palpation with guarding.  Normal bowel sounds x4 quadrants. Muskuloskeletal: No cyanosis, clubbing or edema noted bilaterally Neuro: CN II-XII intact, strength 5/5 x 4, sensation, reflexes intact Skin: No ulcerative lesions noted or rashes Psychiatry: Judgement and insight appear normal. Mood is appropriate for condition and setting          Labs on Admission:  Basic Metabolic Panel: Recent Labs  Lab 05/30/22 0049  NA 139  K 3.9  CL 104  CO2 27  GLUCOSE 121*  BUN 14  CREATININE 0.97  CALCIUM 9.3  MG 2.2  PHOS 2.5   Liver Function Tests: Recent Labs  Lab 05/30/22 0049  AST 23  ALT 16  ALKPHOS 50  BILITOT 0.7  PROT 7.6  ALBUMIN 4.5   Recent Labs  Lab 05/30/22 0049  LIPASE 25   No results for input(s): "AMMONIA" in the last 168 hours. CBC: Recent Labs  Lab 05/30/22 0049  WBC 9.2  NEUTROABS 6.6  HGB 16.0  HCT 47.9  MCV 90.7  PLT 278   Cardiac Enzymes: No results for input(s): "CKTOTAL", "CKMB", "CKMBINDEX", "TROPONINI" in the last 168 hours.  BNP (last 3 results) No results for input(s): "BNP" in the last 8760 hours.  ProBNP (last 3 results) No results for input(s): "PROBNP" in the last 8760 hours.  CBG: No results for input(s): "GLUCAP" in the last  168 hours.  Radiological Exams on Admission: DG Chest Portable 1 View  Result Date: 05/30/2022 CLINICAL DATA:  81 year old male status post nasogastric tube placement. EXAM: PORTABLE CHEST 1 VIEW COMPARISON:  Chest x-ray 05/30/2022. FINDINGS: Nasogastric tube again noted, with tip in the distal aspect of the body of the stomach. Lung volumes are low. Bibasilar opacities (left-greater-than-right), favored to predominantly reflect areas of subsegmental atelectasis, although underlying airspace consolidation in the left lower lobe is difficult to entirely exclude. No pleural effusions. No pneumothorax. No evidence of pulmonary edema. Heart size is normal. Upper mediastinal contours are within normal limits. Atherosclerotic calcifications are noted in the thoracic aorta. Lung apices are incompletely imaged on this examination. IMPRESSION: 1. Tip of nasogastric tube is in the distal body of the stomach. 2. Low lung  volumes with bibasilar (left-greater-than-right) areas of atelectasis and/or consolidation. Electronically Signed   By: Vinnie Langton M.D.   On: 05/30/2022 05:11   DG Chest Portable 1 View  Result Date: 05/30/2022 CLINICAL DATA:  81 year old male status post nasogastric tube placement. EXAM: PORTABLE CHEST 1 VIEW COMPARISON:  Chest x-ray 07/31/2017. FINDINGS: A nasogastric tube is seen extending into the stomach, however, the tip of the nasogastric tube extends below the lower margin of the image. Lung volumes are low. Bibasilar opacities (left-greater-than-right), favored to predominantly reflect areas of subsegmental atelectasis, although underlying airspace consolidation in the left lower lobe is difficult to entirely exclude. No pleural effusions. No pneumothorax. No evidence of pulmonary edema. Heart size is normal. Upper mediastinal contours are within normal limits. Atherosclerotic calcifications are noted in the thoracic aorta. IMPRESSION: 1. Support apparatus, as above. 2. Low lung volumes  with bibasilar opacities (left-greater-than-right) which may reflect areas of atelectasis and/or consolidation. Electronically Signed   By: Vinnie Langton M.D.   On: 05/30/2022 05:09   CT Abdomen Pelvis W Contrast  Result Date: 05/30/2022 CLINICAL DATA:  Bowel obstruction suspected.  Abdominal pain EXAM: CT ABDOMEN AND PELVIS WITH CONTRAST TECHNIQUE: Multidetector CT imaging of the abdomen and pelvis was performed using the standard protocol following bolus administration of intravenous contrast. RADIATION DOSE REDUCTION: This exam was performed according to the departmental dose-optimization program which includes automated exposure control, adjustment of the mA and/or kV according to patient size and/or use of iterative reconstruction technique. CONTRAST:  189m OMNIPAQUE IOHEXOL 300 MG/ML  SOLN COMPARISON:  02/22/2014 FINDINGS: Lower chest: Coronary artery and aortic calcifications. No acute abnormality Hepatobiliary: Numerous cysts scattered throughout the liver, the largest in the left hepatic lobe measuring 2.5 cm. These have a benign appearance. Gallbladder unremarkable. Pancreas: No focal abnormality or ductal dilatation. Spleen: No focal abnormality.  Normal size. Adrenals/Urinary Tract: 2.5 cm cyst in the midpole of the left kidney. 4.3 cm cyst in the lower pole of the left kidney. These have a benign appearance. No follow-up imaging recommended. No hydronephrosis or stones. Adrenal glands and urinary bladder unremarkable. Stomach/Bowel: Left colonic diverticulosis. No active diverticulitis. Normal appendix. Dilated small bowel loops into the pelvis. Distal small bowel loops are decompressed. Findings compatible with small bowel obstruction. There is a abnormal loop of small bowel in the left abdomen with wall thickening and hyperemia/enhancement suggesting enteritis. This appears to be the cause of the bowel obstruction. Vascular/Lymphatic: No evidence of aneurysm or adenopathy. Reproductive:  Prostate enlargement with central calcifications. Other: No free fluid or free air. Small left inguinal hernia containing fat. Large right inguinal hernia containing small bowel loops. This does not appear to be the level of obstruction. Musculoskeletal: No acute bony abnormality. IMPRESSION: Dilated small bowel loops in the abdomen and pelvis continued 2 a thick walled small bowel loop in the left abdomen/pelvis. Findings concerning for infectious or inflammatory enteritis with resulting small bowel obstruction. Large right inguinal hernia containing small bowel loops. This does not appear to be the level of obstruction. Left colonic diverticulosis. Electronically Signed   By: KRolm BaptiseM.D.   On: 05/30/2022 02:48    EKG: I independently viewed the EKG done and my findings are as followed: EKG was not done in the ED  Assessment/Plan Present on Admission:  Small bowel obstruction (HCC)  Mixed hyperlipidemia  Essential hypertension, benign  Coronary atherosclerosis of native coronary artery  Principal Problem:   Small bowel obstruction ([place] Active Problems:   Coronary atherosclerosis of native coronary  artery   Mixed hyperlipidemia   Essential hypertension, benign   Abdominal pain   Nausea & vomiting   NSTEMI (non-ST elevated myocardial infarction) (HCC)   GERD (gastroesophageal reflux disease)  Abdominal pain, nausea and vomiting secondary to small bowel obstruction Continue NG tube  Continue NPO at this time with plan to advanced diet as tolerated Continue IV hydration Continue IV morphine 2 mg q.4h p.r.n. for moderate/severe pain Continue Zofran/Compazine p.r.n. for nausea/vomiting General surgery will be consulted and we shall await further recommendation  Essential hypertension Toprol-XL, Prinzide, Zestoretic will be held at this time due to small bowel obstruction Continue IV hydralazine 10 mg every 6 hours as needed for SBP > 170  Mixed hyperlipidemia Statin will be  temporarily held at this time due to small bowel obstruction  CAD s/p stent placement Plavix, statin will be temporarily held at this time due to patient being n.p.o. secondary to small bowel obstruction  GERD Continue Protonix  NSTEMI Home meds will be held at this time due to small bowel obstruction   DVT prophylaxis: SCDs (consider starting chemoprophylaxis if no indication for surgical intervention at this time)  Code Status: Full code  Consults: General surgery  Family Communication: Daughter at bedside (all questions answered to satisfaction)  Severity of Illness: The appropriate patient status for this patient is INPATIENT. Inpatient status is judged to be reasonable and necessary in order to provide the required intensity of service to ensure the patient's safety. The patient's presenting symptoms, physical exam findings, and initial radiographic and laboratory data in the context of their chronic comorbidities is felt to place them at high risk for further clinical deterioration. Furthermore, it is not anticipated that the patient will be medically stable for discharge from the hospital within 2 midnights of admission.   * I certify that at the point of admission it is my clinical judgment that the patient will require inpatient hospital care spanning beyond 2 midnights from the point of admission due to high intensity of service, high risk for further deterioration and high frequency of surveillance required.*  Author: Bernadette Hoit, DO 05/30/2022 5:30 AM  For on call review [email or web address].

## 2022-05-30 NOTE — Progress Notes (Signed)
Patient admitted early this morning for abdominal pain with nausea and vomiting secondary to small bowel obstruction and has been started on IV fluids/analgesics/antiemetics along with NG tube.  General surgery consultation is pending.  Patient seen and examined at bedside and plan of care discussed with him.  I have reviewed patient's medical records including this morning's H&P, current vitals, labs and medications myself.  Repeat a.m. labs.

## 2022-05-30 NOTE — ED Notes (Signed)
Patient transported to CT 

## 2022-05-30 NOTE — ED Notes (Signed)
Pt vomiting. Gave compazine per order

## 2022-05-30 NOTE — Progress Notes (Signed)
  Transition of Care [facility]) Screening Note   Patient Details  Name: OZELL JUHASZ Date of Birth: [birth year]   Transition of Care [facility]) CM/SW Contact:    Iona Beard, [place] Phone Number: 05/30/2022, 9:00 AM    Transition of Care Department [facility]) has reviewed patient and no TOC needs have been identified at this time. We will continue to monitor patient advancement through interdisciplinary progression rounds. If new patient transition needs arise, please place a TOC consult.

## 2022-05-30 NOTE — ED Notes (Signed)
Provider at bedside

## 2022-05-30 NOTE — ED Triage Notes (Signed)
C/o abdominal pain that started tonight around 630pm. Pain is increasing in intensity. Denies n/v/d. Denies CP. Denies any urinary issues

## 2022-05-30 NOTE — ED Provider Notes (Signed)
[facility] EMERGENCY DEPARTMENT  Provider Note  CSN: [id number] Arrival date & time: 05/29/22 2354  History Chief Complaint  Patient presents with   Abdominal Pain    David Harmon is a 81 y.o. male with remote history of SBO requiring surgery about 30 years ago reports he was in his usual state of health until after dinner when he began to have gradually worsening diffuse abdominal pain and nausea. Had BM earlier in the day but none since pain started. No fevers. No dysuria.    Home Medications Prior to Admission medications   Medication Sig Start Date End Date Taking? Authorizing Provider  acetaminophen (TYLENOL) 500 MG tablet Take 1,000 mg by mouth every 6 (six) hours as needed for mild pain or moderate pain.    [provider]  aspirin EC 81 MG tablet Take 81 mg by mouth daily.    [provider]  atorvastatin (LIPITOR) 40 MG tablet Take 40 mg by mouth daily.    [provider]  clopidogrel (PLAVIX) 75 MG tablet TAKE 1 TABLET(75 MG) BY MOUTH DAILY 02/04/22   Satira Sark, MD  lisinopril-hydrochlorothiazide (PRINZIDE,ZESTORETIC) 20-12.5 MG per tablet Take 1 tablet by mouth daily.  09/14/13   [provider]  Magnesium 500 MG TABS Take 1 tablet by mouth daily.    [provider]  metoprolol succinate (TOPROL-XL) 25 MG 24 hr tablet TAKE 1 TABLET(25 MG) BY MOUTH DAILY 10/29/21   Satira Sark, MD  Multiple Vitamins-Minerals (DAILY MENS HEALTH FORMULA PO) Take 1 tablet by mouth daily.    [provider]  pantoprazole (PROTONIX) 40 MG tablet Take 1 tablet (40 mg total) by mouth daily. Patient taking differently: Take 40 mg by mouth daily as needed. 08/01/17   Reyne Dumas, MD  spironolactone (ALDACTONE) 25 MG tablet Take 25 mg by mouth daily.    [provider]     Allergies    Patient has no known allergies.   Review of Systems   Review of Systems Please see HPI for pertinent positives and  negatives  Physical Exam BP (!) 149/84   Pulse 66   Temp 98.6 F (37 C) (Oral)   Resp 14   Ht '5\' 9"'$  (1.753 m)   Wt 77.1 kg   SpO2 95%   BMI 25.10 kg/m   Physical Exam Vitals and nursing note reviewed.  Constitutional:      Appearance: Normal appearance.  HENT:     Head: Normocephalic and atraumatic.     Nose: Nose normal.     Mouth/Throat:     Mouth: Mucous membranes are moist.  Eyes:     Extraocular Movements: Extraocular movements intact.     Conjunctiva/sclera: Conjunctivae normal.  Cardiovascular:     Rate and Rhythm: Normal rate.  Pulmonary:     Effort: Pulmonary effort is normal.     Breath sounds: Normal breath sounds.  Abdominal:     Tenderness: There is abdominal tenderness (diffuse). There is guarding.  Musculoskeletal:        General: No swelling. Normal range of motion.     Cervical back: Neck supple.  Skin:    General: Skin is warm and dry.  Neurological:     General: No focal deficit present.     Mental Status: He is alert.  Psychiatric:        Mood and Affect: Mood normal.     ED Results / Procedures / Treatments   EKG None  Procedures Procedures  Medications Ordered in the ED Medications  lactated ringers infusion (has no administration in time range)  fentaNYL (SUBLIMAZE) injection 50 mcg (50 mcg Intravenous Given 05/30/22 0050)  ondansetron (ZOFRAN) injection 4 mg (4 mg Intravenous Given 05/30/22 0048)  iohexol (OMNIPAQUE) 300 MG/ML solution 100 mL (100 mLs Intravenous Contrast Given 05/30/22 0222)  morphine (PF) 4 MG/ML injection 4 mg (4 mg Intravenous Given 05/30/22 0319)    Initial Impression and Plan  Patient here with diffuse abdominal pain and nausea, no vomiting yet. Labs done in triage showed normal CBC, CMP and lipase. I personally viewed the images from radiology studies and agree with radiologist interpretation: CT consistent with SBO with signs of enteritis as the underlying cause. Will begin bowel rest, IV hydration, pain  control and discuss admission with hospitalist.    ED Course   Clinical Course as of 05/30/22 0343  Thu May 30, 2022  0327 Patient is now vomiting. Will order NG tube.  [CS]  [phone] Spoke with Dr. Josephine Cables, Hospitalist, who will evaluate for admission.  [CS]    Clinical Course User Index [CS] Truddie Hidden, MD     MDM Rules/Calculators/A&P Medical Decision Making Problems Addressed: SBO (small bowel obstruction) [facility]): acute illness or injury  Amount and/or Complexity of Data Reviewed Labs: ordered. Decision-making details documented in ED Course. Radiology: ordered and independent interpretation performed. Decision-making details documented in ED Course.  Risk Prescription drug management. Parenteral controlled substances. Decision regarding hospitalization.    Final Clinical Impression(s) / ED Diagnoses Final diagnoses:  SBO (small bowel obstruction) ([place])    Rx / DC Orders ED Discharge Orders     None        Truddie Hidden, MD 05/30/22 [phone]

## 2022-05-31 LAB — CBC
HCT: 42 % (ref 39.0–52.0)
Hemoglobin: 13.3 g/dL (ref 13.0–17.0)
MCH: 29.7 pg (ref 26.0–34.0)
MCHC: 31.7 g/dL (ref 30.0–36.0)
MCV: 93.8 fL (ref 80.0–100.0)
Platelets: 229 10*3/uL (ref 150–400)
RBC: 4.48 MIL/uL (ref 4.22–5.81)
RDW: 13.1 % (ref 11.5–15.5)
WBC: 9.2 10*3/uL (ref 4.0–10.5)
nRBC: 0 % (ref 0.0–0.2)

## 2022-05-31 LAB — COMPREHENSIVE METABOLIC PANEL
ALT: 13 U/L (ref 0–44)
AST: 15 U/L (ref 15–41)
Albumin: 3.3 g/dL — ABNORMAL LOW (ref 3.5–5.0)
Alkaline Phosphatase: 42 U/L (ref 38–126)
Anion gap: 6 (ref 5–15)
BUN: 14 mg/dL (ref 8–23)
CO2: 24 mmol/L (ref 22–32)
Calcium: 8.5 mg/dL — ABNORMAL LOW (ref 8.9–10.3)
Chloride: 108 mmol/L (ref 98–111)
Creatinine, Ser: 0.74 mg/dL (ref 0.61–1.24)
GFR, Estimated: >60 mL/min (ref >60)
Glucose, Bld: 109 mg/dL — ABNORMAL HIGH (ref 70–99)
Potassium: 4 mmol/L (ref 3.5–5.1)
Sodium: 138 mmol/L (ref 135–145)
Total Bilirubin: 0.9 mg/dL (ref 0.3–1.2)
Total Protein: 5.8 g/dL — ABNORMAL LOW (ref 6.5–8.1)

## 2022-05-31 LAB — MAGNESIUM: Magnesium: 2.2 mg/dL (ref 1.7–2.4)

## 2022-05-31 MED ORDER — METOPROLOL SUCCINATE ER 25 MG PO TB24
25.0000 mg | ORAL_TABLET | Freq: Every day | ORAL | Status: DC
  Administered 2022-05-31 – 2022-06-01 (×2): 25 mg via ORAL
  Filled 2022-05-31 (×2): qty 1

## 2022-05-31 MED ORDER — BISACODYL 10 MG RE SUPP
10.0000 mg | Freq: Once | RECTAL | Status: AC
  Administered 2022-05-31: 10 mg via RECTAL
  Filled 2022-05-31: qty 1

## 2022-05-31 NOTE — Progress Notes (Signed)
Patient arrived to unit in stable condition he is alert and oriented x 4. Remains on LR at 75hr. Able to make needs known. Call bell and hydration within reach.       05/31/22 1339  Vitals  Temp 98.1 F (36.7 C)  BP (!) 187/95  MAP (mmHg) 121  BP Location Right Arm  BP Method Automatic  Patient Position (if appropriate) Lying  Pulse Rate 85  Pulse Rate Source Monitor  Resp 17  MEWS COLOR  MEWS Score Color Green  Oxygen Therapy  SpO2 98 %  O2 Device Room Air  Pain Assessment  Pain Scale 0-10  Pain Score 0  MEWS Score  MEWS Temp 0  MEWS Systolic 0  MEWS Pulse 0  MEWS RR 0  MEWS LOC 0  MEWS Score 0

## 2022-05-31 NOTE — Progress Notes (Signed)
Subjective: Patient is passing flatus and has the urge to have a bowel movement.  He denies any nausea or vomiting.  Denies any abdominal pain.  Objective: Vital signs in last 24 hours: Temp:  [97.6 F (36.4 C)-98.7 F (37.1 C)] 98.5 F (36.9 C) (06/16 0700) Pulse Rate:  [71-81] 71 (06/16 0405) Resp:  [14-18] 17 (06/16 0405) BP: (132-177)/(74-97) 156/78 (06/16 0405) SpO2:  [95 %-99 %] 97 % (06/16 0405) Weight:  [79.5 kg] 79.5 kg (06/16 0405) Last BM Date : 05/29/22  Intake/Output from previous day: 06/15 0701 - 06/16 0700 In: 2363.8 [I.V.:2363.8] Out: 525 [Urine:325; Emesis/NG output:200] Intake/Output this shift: No intake/output data recorded.  General appearance: alert, cooperative, and no distress GI: soft, non-tender; bowel sounds normal; no masses,  no organomegaly  Lab Results:  Recent Labs    05/30/22 0049 05/31/22 0353  WBC 9.2 9.2  HGB 16.0 13.3  HCT 47.9 42.0  PLT 278 229   BMET Recent Labs    05/30/22 0049 05/31/22 0353  NA 139 138  K 3.9 4.0  CL 104 108  CO2 27 24  GLUCOSE 121* 109*  BUN 14 14  CREATININE 0.97 0.74  CALCIUM 9.3 8.5*   PT/INR No results for input(s): "LABPROT", "INR" in the last 72 hours.  Studies/Results: DG Chest Portable 1 View  Result Date: 05/30/2022 CLINICAL DATA:  81 year old male status post nasogastric tube placement. EXAM: PORTABLE CHEST 1 VIEW COMPARISON:  Chest x-ray 05/30/2022. FINDINGS: Nasogastric tube again noted, with tip in the distal aspect of the body of the stomach. Lung volumes are low. Bibasilar opacities (left-greater-than-right), favored to predominantly reflect areas of subsegmental atelectasis, although underlying airspace consolidation in the left lower lobe is difficult to entirely exclude. No pleural effusions. No pneumothorax. No evidence of pulmonary edema. Heart size is normal. Upper mediastinal contours are within normal limits. Atherosclerotic calcifications are noted in the thoracic aorta.  Lung apices are incompletely imaged on this examination. IMPRESSION: 1. Tip of nasogastric tube is in the distal body of the stomach. 2. Low lung volumes with bibasilar (left-greater-than-right) areas of atelectasis and/or consolidation. Electronically Signed   By: Vinnie Langton M.D.   On: 05/30/2022 05:11   DG Chest Portable 1 View  Result Date: 05/30/2022 CLINICAL DATA:  81 year old male status post nasogastric tube placement. EXAM: PORTABLE CHEST 1 VIEW COMPARISON:  Chest x-ray 07/31/2017. FINDINGS: A nasogastric tube is seen extending into the stomach, however, the tip of the nasogastric tube extends below the lower margin of the image. Lung volumes are low. Bibasilar opacities (left-greater-than-right), favored to predominantly reflect areas of subsegmental atelectasis, although underlying airspace consolidation in the left lower lobe is difficult to entirely exclude. No pleural effusions. No pneumothorax. No evidence of pulmonary edema. Heart size is normal. Upper mediastinal contours are within normal limits. Atherosclerotic calcifications are noted in the thoracic aorta. IMPRESSION: 1. Support apparatus, as above. 2. Low lung volumes with bibasilar opacities (left-greater-than-right) which may reflect areas of atelectasis and/or consolidation. Electronically Signed   By: Vinnie Langton M.D.   On: 05/30/2022 05:09   CT Abdomen Pelvis W Contrast  Result Date: 05/30/2022 CLINICAL DATA:  Bowel obstruction suspected.  Abdominal pain EXAM: CT ABDOMEN AND PELVIS WITH CONTRAST TECHNIQUE: Multidetector CT imaging of the abdomen and pelvis was performed using the standard protocol following bolus administration of intravenous contrast. RADIATION DOSE REDUCTION: This exam was performed according to the departmental dose-optimization program which includes automated exposure control, adjustment of the mA and/or kV according to patient  size and/or use of iterative reconstruction technique. CONTRAST:  147m  OMNIPAQUE IOHEXOL 300 MG/ML  SOLN COMPARISON:  02/22/2014 FINDINGS: Lower chest: Coronary artery and aortic calcifications. No acute abnormality Hepatobiliary: Numerous cysts scattered throughout the liver, the largest in the left hepatic lobe measuring 2.5 cm. These have a benign appearance. Gallbladder unremarkable. Pancreas: No focal abnormality or ductal dilatation. Spleen: No focal abnormality.  Normal size. Adrenals/Urinary Tract: 2.5 cm cyst in the midpole of the left kidney. 4.3 cm cyst in the lower pole of the left kidney. These have a benign appearance. No follow-up imaging recommended. No hydronephrosis or stones. Adrenal glands and urinary bladder unremarkable. Stomach/Bowel: Left colonic diverticulosis. No active diverticulitis. Normal appendix. Dilated small bowel loops into the pelvis. Distal small bowel loops are decompressed. Findings compatible with small bowel obstruction. There is a abnormal loop of small bowel in the left abdomen with wall thickening and hyperemia/enhancement suggesting enteritis. This appears to be the cause of the bowel obstruction. Vascular/Lymphatic: No evidence of aneurysm or adenopathy. Reproductive: Prostate enlargement with central calcifications. Other: No free fluid or free air. Small left inguinal hernia containing fat. Large right inguinal hernia containing small bowel loops. This does not appear to be the level of obstruction. Musculoskeletal: No acute bony abnormality. IMPRESSION: Dilated small bowel loops in the abdomen and pelvis continued 2 a thick walled small bowel loop in the left abdomen/pelvis. Findings concerning for infectious or inflammatory enteritis with resulting small bowel obstruction. Large right inguinal hernia containing small bowel loops. This does not appear to be the level of obstruction. Left colonic diverticulosis. Electronically Signed   By: KRolm BaptiseM.D.   On: 05/30/2022 02:48    Anti-infectives: Anti-infectives (From admission,  onward)    None       Assessment/Plan: Impression: Small bowel obstruction, slowly resolving.  Awaiting full return of bowel function.  NG tube output has been minimal.  May advance diet and remove NG tube once patient has a bowel movement.  A Dulcolax suppository has been ordered.  LOS: 1 day    MAviva Signs6/16/2023

## 2022-05-31 NOTE — Progress Notes (Signed)
PROGRESS NOTE    David HARPSTER  DUK:[id number] DOB: 11/17/1941 DOA: 05/30/2022 PCP: Caryl Bis, MD   Brief Narrative:  81 y.o. male with medical history significant of hypertension, hyperlipidemia, CADs/p stent placement, NSTEMI presented with worsening abdominal pain.  On presentation, CT abdomen and pelvis with contrast showed dilated small bowel loops continued 2 a thick walled small bowel loop in the left abdomen/pelvis. Findings concerning for infectious or inflammatory enteritis with resulting small bowel obstruction.  He was started on IV fluids, antiemetics and analgesics.  NG tube was placed.  General surgery was consulted.  Assessment & Plan:   Small bowel obstruction presenting with abdominal pain, nausea and vomiting -Currently has NG tube.  General surgery following.  Currently NPO.  No bowel movement yet but passing gas.  Continue IV fluids/antiemetics and analgesics as needed.  Diet advancement as per general surgery.  Essential hypertension -Oral meds on hold.  Monitor blood pressure.  Use IV hydralazine as needed.  Hyperlipidemia -Statin on hold for now  CAD status post stent placement -Stable.  No chest pain.  Plavix on hold.  GERD -continue Protonix     DVT prophylaxis: SCDs Code Status: Full Family Communication: Son at bedside on 05/30/2022 Disposition Plan: Status is: Inpatient Remains inpatient appropriate because: Of severity of illness    Consultants: General surgery  Procedures: None  Antimicrobials: None   Subjective: Patient seen and examined at bedside.  Feels better and states that his abdominal pain is improving.  Passing gas but has not had a bowel movement yet.  No vomiting reported.  Objective: Vitals:   05/30/22 2000 05/30/22 2300 05/31/22 0405 05/31/22 0700  BP:  132/74 (!) 156/78   Pulse:  71 71   Resp:  18 17   Temp: 98.7 F (37.1 C) 98.2 F (36.8 C) 97.9 F (36.6 C) 98.5 F (36.9 C)  TempSrc: Oral Oral Oral Oral   SpO2:  98% 97%   Weight:   79.5 kg   Height:        Intake/Output Summary (Last 24 hours) at 05/31/2022 0916 Last data filed at 05/31/2022 0400 Gross per 24 hour  Intake 1744.1 ml  Output 525 ml  Net 1219.1 ml   Filed Weights   05/30/22 0026 05/31/22 0405  Weight: 77.1 kg 79.5 kg    Examination:  General exam: Appears calm and comfortable.  Currently on room air.  No distress. ENT: NG tube present Respiratory system: Bilateral decreased breath sounds at bases Cardiovascular system: S1 & S2 heard, Rate controlled Gastrointestinal system: Abdomen is distended, soft and nontender.  Bowel sounds sluggish  extremities: No cyanosis, clubbing, edema  Central nervous system: Alert and oriented. No focal neurological deficits. Moving extremities Skin: No rashes, lesions or ulcers Psychiatry: Judgement and insight appear normal. Mood & affect appropriate.     Data Reviewed: I have personally reviewed following labs and imaging studies  CBC: Recent Labs  Lab 05/30/22 0049 05/31/22 0353  WBC 9.2 9.2  NEUTROABS 6.6  --   HGB 16.0 13.3  HCT 47.9 42.0  MCV 90.7 93.8  PLT 278 376   Basic Metabolic Panel: Recent Labs  Lab 05/30/22 0049 05/31/22 0353  NA 139 138  K 3.9 4.0  CL 104 108  CO2 27 24  GLUCOSE 121* 109*  BUN 14 14  CREATININE 0.97 0.74  CALCIUM 9.3 8.5*  MG 2.2 2.2  PHOS 2.5  --    GFR: Estimated Creatinine Clearance: 74.9 mL/min (by C-G formula based  on SCr of 0.74 mg/dL). Liver Function Tests: Recent Labs  Lab 05/30/22 0049 05/31/22 0353  AST 23 15  ALT 16 13  ALKPHOS 50 42  BILITOT 0.7 0.9  PROT 7.6 5.8*  ALBUMIN 4.5 3.3*   Recent Labs  Lab 05/30/22 0049  LIPASE 25   No results for input(s): "AMMONIA" in the last 168 hours. Coagulation Profile: No results for input(s): "INR", "PROTIME" in the last 168 hours. Cardiac Enzymes: No results for input(s): "CKTOTAL", "CKMB", "CKMBINDEX", "TROPONINI" in the last 168 hours. BNP (last 3  results) No results for input(s): "PROBNP" in the last 8760 hours. HbA1C: No results for input(s): "HGBA1C" in the last 72 hours. CBG: No results for input(s): "GLUCAP" in the last 168 hours. Lipid Profile: No results for input(s): "CHOL", "HDL", "LDLCALC", "TRIG", "CHOLHDL", "LDLDIRECT" in the last 72 hours. Thyroid Function Tests: No results for input(s): "TSH", "T4TOTAL", "FREET4", "T3FREE", "THYROIDAB" in the last 72 hours. Anemia Panel: No results for input(s): "VITAMINB12", "FOLATE", "FERRITIN", "TIBC", "IRON", "RETICCTPCT" in the last 72 hours. Sepsis Labs: No results for input(s): "PROCALCITON", "LATICACIDVEN" in the last 168 hours.  Recent Results (from the past 240 hour(s))  MRSA Next Gen by PCR, Nasal     Status: None   Collection Time: 05/30/22  6:27 AM   Specimen: Nasal Mucosa; Nasal Swab  Result Value Ref Range Status   MRSA by PCR Next Gen NOT DETECTED NOT DETECTED Final    Comment: (NOTE) The GeneXpert MRSA Assay (FDA approved for NASAL specimens only), is one component of a comprehensive MRSA colonization surveillance program. It is not intended to diagnose MRSA infection nor to guide or monitor treatment for MRSA infections. Test performance is not FDA approved in patients less than 2 years old. Performed at [facility], [address] [place], [place] [address]          Radiology Studies: DG Chest Portable 1 View  Result Date: 05/30/2022 CLINICAL DATA:  81 year old male status post nasogastric tube placement. EXAM: PORTABLE CHEST 1 VIEW COMPARISON:  Chest x-ray 05/30/2022. FINDINGS: Nasogastric tube again noted, with tip in the distal aspect of the body of the stomach. Lung volumes are low. Bibasilar opacities (left-greater-than-right), favored to predominantly reflect areas of subsegmental atelectasis, although underlying airspace consolidation in the left lower lobe is difficult to entirely exclude. No pleural effusions. No pneumothorax. No evidence of  pulmonary edema. Heart size is normal. Upper mediastinal contours are within normal limits. Atherosclerotic calcifications are noted in the thoracic aorta. Lung apices are incompletely imaged on this examination. IMPRESSION: 1. Tip of nasogastric tube is in the distal body of the stomach. 2. Low lung volumes with bibasilar (left-greater-than-right) areas of atelectasis and/or consolidation. Electronically Signed   By: Vinnie Langton M.D.   On: 05/30/2022 05:11   DG Chest Portable 1 View  Result Date: 05/30/2022 CLINICAL DATA:  81 year old male status post nasogastric tube placement. EXAM: PORTABLE CHEST 1 VIEW COMPARISON:  Chest x-ray 07/31/2017. FINDINGS: A nasogastric tube is seen extending into the stomach, however, the tip of the nasogastric tube extends below the lower margin of the image. Lung volumes are low. Bibasilar opacities (left-greater-than-right), favored to predominantly reflect areas of subsegmental atelectasis, although underlying airspace consolidation in the left lower lobe is difficult to entirely exclude. No pleural effusions. No pneumothorax. No evidence of pulmonary edema. Heart size is normal. Upper mediastinal contours are within normal limits. Atherosclerotic calcifications are noted in the thoracic aorta. IMPRESSION: 1. Support apparatus, as above. 2. Low lung volumes  with bibasilar opacities (left-greater-than-right) which may reflect areas of atelectasis and/or consolidation. Electronically Signed   By: Vinnie Langton M.D.   On: 05/30/2022 05:09   CT Abdomen Pelvis W Contrast  Result Date: 05/30/2022 CLINICAL DATA:  Bowel obstruction suspected.  Abdominal pain EXAM: CT ABDOMEN AND PELVIS WITH CONTRAST TECHNIQUE: Multidetector CT imaging of the abdomen and pelvis was performed using the standard protocol following bolus administration of intravenous contrast. RADIATION DOSE REDUCTION: This exam was performed according to the departmental dose-optimization program which  includes automated exposure control, adjustment of the mA and/or kV according to patient size and/or use of iterative reconstruction technique. CONTRAST:  147m OMNIPAQUE IOHEXOL 300 MG/ML  SOLN COMPARISON:  02/22/2014 FINDINGS: Lower chest: Coronary artery and aortic calcifications. No acute abnormality Hepatobiliary: Numerous cysts scattered throughout the liver, the largest in the left hepatic lobe measuring 2.5 cm. These have a benign appearance. Gallbladder unremarkable. Pancreas: No focal abnormality or ductal dilatation. Spleen: No focal abnormality.  Normal size. Adrenals/Urinary Tract: 2.5 cm cyst in the midpole of the left kidney. 4.3 cm cyst in the lower pole of the left kidney. These have a benign appearance. No follow-up imaging recommended. No hydronephrosis or stones. Adrenal glands and urinary bladder unremarkable. Stomach/Bowel: Left colonic diverticulosis. No active diverticulitis. Normal appendix. Dilated small bowel loops into the pelvis. Distal small bowel loops are decompressed. Findings compatible with small bowel obstruction. There is a abnormal loop of small bowel in the left abdomen with wall thickening and hyperemia/enhancement suggesting enteritis. This appears to be the cause of the bowel obstruction. Vascular/Lymphatic: No evidence of aneurysm or adenopathy. Reproductive: Prostate enlargement with central calcifications. Other: No free fluid or free air. Small left inguinal hernia containing fat. Large right inguinal hernia containing small bowel loops. This does not appear to be the level of obstruction. Musculoskeletal: No acute bony abnormality. IMPRESSION: Dilated small bowel loops in the abdomen and pelvis continued 2 a thick walled small bowel loop in the left abdomen/pelvis. Findings concerning for infectious or inflammatory enteritis with resulting small bowel obstruction. Large right inguinal hernia containing small bowel loops. This does not appear to be the level of  obstruction. Left colonic diverticulosis. Electronically Signed   By: KRolm BaptiseM.D.   On: 05/30/2022 02:48        Scheduled Meds:  Chlorhexidine Gluconate Cloth  6 each Topical Q0600   pantoprazole (PROTONIX) IV  40 mg Intravenous Q24H   Continuous Infusions:  lactated ringers 125 mL/hr at 05/30/22 2257          KAline August MD [facility] 05/31/2022, 9:16 AM

## 2022-05-31 NOTE — Progress Notes (Signed)
   05/31/22 1440  Vitals  BP (!) 176/92  MAP (mmHg) 111  BP Location Right Arm  BP Method Automatic  Patient Position (if appropriate) Lying  Pulse Rate 92  Pulse Rate Source Monitor  MEWS COLOR  MEWS Score Color Green  MEWS Score  MEWS Temp 0  MEWS Systolic 0  MEWS Pulse 0  MEWS RR 0  MEWS LOC 0  MEWS Score 0

## 2022-05-31 NOTE — Progress Notes (Signed)
Patient verbalized headache is much better, given oral metoprolol , BP stable at this time     05/31/22 1546  Vitals  BP (!) 147/83  MAP (mmHg) 101  BP Method Automatic  Pulse Rate 80  MEWS COLOR  MEWS Score Color Green  MEWS Score  MEWS Temp 0  MEWS Systolic 0  MEWS Pulse 0  MEWS RR 0  MEWS LOC 0  MEWS Score 0

## 2022-05-31 NOTE — Care Management Important Message (Signed)
Important Message  Patient Details  Name: David Harmon MRN: [id number] Date of Birth: Nov 13, 1941   Medicare Important Message Given:  Yes     [name] 05/31/2022, 3:36 PM

## 2022-06-01 LAB — MAGNESIUM: Magnesium: 2.1 mg/dL (ref 1.7–2.4)

## 2022-06-01 LAB — BASIC METABOLIC PANEL
Anion gap: 6 (ref 5–15)
BUN: 10 mg/dL (ref 8–23)
CO2: 24 mmol/L (ref 22–32)
Calcium: 8.4 mg/dL — ABNORMAL LOW (ref 8.9–10.3)
Chloride: 106 mmol/L (ref 98–111)
Creatinine, Ser: 0.77 mg/dL (ref 0.61–1.24)
GFR, Estimated: >60 mL/min (ref >60)
Glucose, Bld: 94 mg/dL (ref 70–99)
Potassium: 3.5 mmol/L (ref 3.5–5.1)
Sodium: 136 mmol/L (ref 135–145)

## 2022-06-01 MED ORDER — PANTOPRAZOLE SODIUM 40 MG PO TBEC
40.0000 mg | DELAYED_RELEASE_TABLET | Freq: Every day | ORAL | Status: DC
  Administered 2022-06-01: 40 mg via ORAL
  Filled 2022-06-01: qty 1

## 2022-06-01 MED ORDER — DICYCLOMINE HCL 10 MG PO CAPS: 10.0000 mg | ORAL_CAPSULE | Freq: Two times a day (BID) | ORAL | Status: DC | PRN

## 2022-06-01 NOTE — Progress Notes (Signed)
  Subjective: Patient feels much better.  NG tube out.  Tolerating clear liquid diet well.  Has had multiple bowel movements over the last 24 hours.  No abdominal pain is noted.  Denies any nausea or vomiting.  Objective: Vital signs in last 24 hours: Temp:  [98.1 F (36.7 C)-98.7 F (37.1 C)] 98.7 F (37.1 C) (06/17 0445) Pulse Rate:  [70-92] 72 (06/17 0445) Resp:  [17-20] 19 (06/17 0445) BP: (130-187)/(79-95) 152/79 (06/17 0445) SpO2:  [96 %-100 %] 98 % (06/17 0445) Weight:  [77.4 kg] 77.4 kg (06/17 0445) Last BM Date : 05/30/22  Intake/Output from previous day: No intake/output data recorded. Intake/Output this shift: No intake/output data recorded.  General appearance: alert, cooperative, and no distress GI: soft, non-tender; bowel sounds normal; no masses,  no organomegaly  Lab Results:  Recent Labs    05/30/22 0049 05/31/22 0353  WBC 9.2 9.2  HGB 16.0 13.3  HCT 47.9 42.0  PLT 278 229   BMET Recent Labs    05/30/22 0049 05/31/22 0353  NA 139 138  K 3.9 4.0  CL 104 108  CO2 27 24  GLUCOSE 121* 109*  BUN 14 14  CREATININE 0.97 0.74  CALCIUM 9.3 8.5*   PT/INR No results for input(s): "LABPROT", "INR" in the last 72 hours.  Studies/Results: No results found.  Anti-infectives: Anti-infectives (From admission, onward)    None       Assessment/Plan: Impression: Partial small bowel obstruction, resolved Plan: We will advance to heart healthy diet.  Should he tolerate this, he is okay for discharge from surgery standpoint.  Given his repeat episodes of epigastric pain, I have recommended outpatient GI follow-up to the patient.  He understands and agrees.  LOS: 2 days    Aviva Signs 06/01/2022

## 2022-06-01 NOTE — Progress Notes (Signed)
Nursing Discharge Note  Admit Date:  05/30/2022 Discharge date: 06/01/2022   Harriette Bouillon to be D/C'd Home per MD order.  AVS completed.  Patient/caregiver able to verbalize understanding.  Discharge Medication: Allergies as of 06/01/2022   No Known Allergies      Medication List     TAKE these medications    acetaminophen 500 MG tablet Commonly known as: TYLENOL Take 1,000 mg by mouth every 6 (six) hours as needed for mild pain or moderate pain.   aspirin EC 81 MG tablet Take 81 mg by mouth daily.   atorvastatin 40 MG tablet Commonly known as: LIPITOR Take 40 mg by mouth daily.   clopidogrel 75 MG tablet Commonly known as: PLAVIX TAKE 1 TABLET(75 MG) BY MOUTH DAILY What changed: See the new instructions.   DAILY MENS HEALTH FORMULA PO Take 1 tablet by mouth daily.   dicyclomine 10 MG capsule Commonly known as: BENTYL Take 1 capsule (10 mg total) by mouth 2 (two) times daily as needed for spasms. What changed:  when to take this reasons to take this   lisinopril-hydrochlorothiazide 20-12.5 MG tablet Commonly known as: ZESTORETIC Take 1 tablet by mouth daily.   Magnesium 500 MG Tabs Take 1 tablet by mouth daily.   metoprolol succinate 25 MG 24 hr tablet Commonly known as: TOPROL-XL TAKE 1 TABLET(25 MG) BY MOUTH DAILY What changed: See the new instructions.   pantoprazole 40 MG tablet Commonly known as: PROTONIX Take 1 tablet (40 mg total) by mouth daily. What changed:  when to take this reasons to take this   spironolactone 25 MG tablet Commonly known as: ALDACTONE Take 25 mg by mouth daily.        Discharge Assessment: Vitals:   06/01/22 0125 06/01/22 0445  BP: (!) 136/94 (!) 152/79  Pulse: 70 72  Resp: 18 19  Temp: 98.3 F (36.8 C) 98.7 F (37.1 C)  SpO2: 96% 98%  Skin clean, dry and intact without evidence of skin break down, no evidence of skin tears noted.  IV catheter discontinued intact. Site without signs and symptoms of  complications - no redness or edema noted at insertion site, patient denies c/o pain - only slight tenderness at site.  Dressing with slight pressure applied.  D/c Instructions-Education: Discharge instructions given to patient/family with verbalized understanding.  D/c education completed with patient/family including follow up instructions, medication list, d/c activities limitations if indicated, with other d/c instructions as indicated by MD - patient able to verbalize understanding, all questions fully answered.  Patient instructed to return to ED, call 911, or call MD for any changes in condition.  Patient escorted via [place], and D/C home via private auto.  Alfonse Alpers, RN 06/01/2022 12:14 PM

## 2022-06-01 NOTE — Discharge Summary (Addendum)
Physician Discharge Summary  David Harmon:[id number] DOB: September 23, 1941 DOA: 05/30/2022  PCP: Caryl Bis, MD  Admit date: 05/30/2022 Discharge date: 06/01/2022  Admitted From: Home Disposition: Home  Recommendations for Outpatient Follow-up:  Follow up with PCP in 1 week with repeat CBC/BMP Follow up in ED if symptoms worsen or new appear   Home Health: No Equipment/Devices: None  Discharge Condition: Stable CODE STATUS: Full Diet recommendation: Heart healthy  Brief/Interim Summary: 81 y.o. male with medical history significant of hypertension, hyperlipidemia, CADs/p stent placement, NSTEMI presented with worsening abdominal pain.  On presentation, CT abdomen and pelvis with contrast showed dilated small bowel loops continued 2 a thick walled small bowel loop in the left abdomen/pelvis. Findings concerning for infectious or inflammatory enteritis with resulting small bowel obstruction.  He was started on IV fluids, antiemetics and analgesics.  NG tube was placed.  General surgery was consulted.  During the hospitalization, his condition has improved.  He has been started on a diet which has been advanced.  He is having bowel movements with improved abdominal pain.  General surgery has cleared the patient for discharge.  He will be discharged home today.  Discharge Diagnoses:   Small bowel obstruction presenting with abdominal pain, nausea and vomiting -Treated conservatively with NG tube, IV fluids/antiemetics and analgesics as needed.  -During the hospitalization, his condition has improved.  He has been started on a diet which has been advanced.  He is having bowel movements with improved abdominal pain.  General surgery has cleared the patient for discharge.  He will be discharged home today.   Essential hypertension -Resume oral home meds on discharge.  Hyperlipidemia -Resume statin on discharge  CAD status post stent placement -Stable.  No chest pain.  Resume Plavix  on discharge  GERD -continue Protonix -General surgery recommends outpatient GI evaluation and follow-up.  Discharge Instructions   Allergies as of 06/01/2022   No Known Allergies      Medication List     STOP taking these medications    dicyclomine 10 MG capsule Commonly known as: BENTYL       TAKE these medications    acetaminophen 500 MG tablet Commonly known as: TYLENOL Take 1,000 mg by mouth every 6 (six) hours as needed for mild pain or moderate pain.   aspirin EC 81 MG tablet Take 81 mg by mouth daily.   atorvastatin 40 MG tablet Commonly known as: LIPITOR Take 40 mg by mouth daily.   clopidogrel 75 MG tablet Commonly known as: PLAVIX TAKE 1 TABLET(75 MG) BY MOUTH DAILY What changed: See the new instructions.   DAILY MENS HEALTH FORMULA PO Take 1 tablet by mouth daily.   lisinopril-hydrochlorothiazide 20-12.5 MG tablet Commonly known as: ZESTORETIC Take 1 tablet by mouth daily.   Magnesium 500 MG Tabs Take 1 tablet by mouth daily.   metoprolol succinate 25 MG 24 hr tablet Commonly known as: TOPROL-XL TAKE 1 TABLET(25 MG) BY MOUTH DAILY What changed: See the new instructions.   pantoprazole 40 MG tablet Commonly known as: PROTONIX Take 1 tablet (40 mg total) by mouth daily. What changed:  when to take this reasons to take this   spironolactone 25 MG tablet Commonly known as: ALDACTONE Take 25 mg by mouth daily.           Follow-up Information     Caryl Bis, MD. Schedule an appointment as soon as possible for a visit in 1 week(s).   Specialty: Family Medicine Contact information:  [place] [address] [phone]         Satira Sark, MD .   Specialty: Cardiology Contact information: [place] [address] [phone]                No Known Allergies  Consultations: General surgery   Procedures/Studies: DG Chest Portable 1 View  Result Date: 05/30/2022 CLINICAL  DATA:  81 year old male status post nasogastric tube placement. EXAM: PORTABLE CHEST 1 VIEW COMPARISON:  Chest x-ray 05/30/2022. FINDINGS: Nasogastric tube again noted, with tip in the distal aspect of the body of the stomach. Lung volumes are low. Bibasilar opacities (left-greater-than-right), favored to predominantly reflect areas of subsegmental atelectasis, although underlying airspace consolidation in the left lower lobe is difficult to entirely exclude. No pleural effusions. No pneumothorax. No evidence of pulmonary edema. Heart size is normal. Upper mediastinal contours are within normal limits. Atherosclerotic calcifications are noted in the thoracic aorta. Lung apices are incompletely imaged on this examination. IMPRESSION: 1. Tip of nasogastric tube is in the distal body of the stomach. 2. Low lung volumes with bibasilar (left-greater-than-right) areas of atelectasis and/or consolidation. Electronically Signed   By: Vinnie Langton M.D.   On: 05/30/2022 05:11   DG Chest Portable 1 View  Result Date: 05/30/2022 CLINICAL DATA:  81 year old male status post nasogastric tube placement. EXAM: PORTABLE CHEST 1 VIEW COMPARISON:  Chest x-ray 07/31/2017. FINDINGS: A nasogastric tube is seen extending into the stomach, however, the tip of the nasogastric tube extends below the lower margin of the image. Lung volumes are low. Bibasilar opacities (left-greater-than-right), favored to predominantly reflect areas of subsegmental atelectasis, although underlying airspace consolidation in the left lower lobe is difficult to entirely exclude. No pleural effusions. No pneumothorax. No evidence of pulmonary edema. Heart size is normal. Upper mediastinal contours are within normal limits. Atherosclerotic calcifications are noted in the thoracic aorta. IMPRESSION: 1. Support apparatus, as above. 2. Low lung volumes with bibasilar opacities (left-greater-than-right) which may reflect areas of atelectasis and/or  consolidation. Electronically Signed   By: Vinnie Langton M.D.   On: 05/30/2022 05:09   CT Abdomen Pelvis W Contrast  Result Date: 05/30/2022 CLINICAL DATA:  Bowel obstruction suspected.  Abdominal pain EXAM: CT ABDOMEN AND PELVIS WITH CONTRAST TECHNIQUE: Multidetector CT imaging of the abdomen and pelvis was performed using the standard protocol following bolus administration of intravenous contrast. RADIATION DOSE REDUCTION: This exam was performed according to the departmental dose-optimization program which includes automated exposure control, adjustment of the mA and/or kV according to patient size and/or use of iterative reconstruction technique. CONTRAST:  146m OMNIPAQUE IOHEXOL 300 MG/ML  SOLN COMPARISON:  02/22/2014 FINDINGS: Lower chest: Coronary artery and aortic calcifications. No acute abnormality Hepatobiliary: Numerous cysts scattered throughout the liver, the largest in the left hepatic lobe measuring 2.5 cm. These have a benign appearance. Gallbladder unremarkable. Pancreas: No focal abnormality or ductal dilatation. Spleen: No focal abnormality.  Normal size. Adrenals/Urinary Tract: 2.5 cm cyst in the midpole of the left kidney. 4.3 cm cyst in the lower pole of the left kidney. These have a benign appearance. No follow-up imaging recommended. No hydronephrosis or stones. Adrenal glands and urinary bladder unremarkable. Stomach/Bowel: Left colonic diverticulosis. No active diverticulitis. Normal appendix. Dilated small bowel loops into the pelvis. Distal small bowel loops are decompressed. Findings compatible with small bowel obstruction. There is a abnormal loop of small bowel in the left abdomen with wall thickening and hyperemia/enhancement suggesting enteritis. This appears  to be the cause of the bowel obstruction. Vascular/Lymphatic: No evidence of aneurysm or adenopathy. Reproductive: Prostate enlargement with central calcifications. Other: No free fluid or free air. Small left inguinal  hernia containing fat. Large right inguinal hernia containing small bowel loops. This does not appear to be the level of obstruction. Musculoskeletal: No acute bony abnormality. IMPRESSION: Dilated small bowel loops in the abdomen and pelvis continued 2 a thick walled small bowel loop in the left abdomen/pelvis. Findings concerning for infectious or inflammatory enteritis with resulting small bowel obstruction. Large right inguinal hernia containing small bowel loops. This does not appear to be the level of obstruction. Left colonic diverticulosis. Electronically Signed   By: Rolm Baptise M.D.   On: 05/30/2022 02:48      Subjective: Patient seen and examined at bedside.  Denies any overnight fever, worsening abdominal pain, vomiting.  Having bowel movements.  Feels okay to go home today.  Discharge Exam: Vitals:   06/01/22 0125 06/01/22 0445  BP: (!) 136/94 (!) 152/79  Pulse: 70 72  Resp: 18 19  Temp: 98.3 F (36.8 C) 98.7 F (37.1 C)  SpO2: 96% 98%    General: Pt is alert, awake, not in acute distress Cardiovascular: rate controlled, S1/S2 + Respiratory: bilateral decreased breath sounds at bases Abdominal: Soft, NT, slightly distended, bowel sounds + Extremities: no edema, no cyanosis    The results of significant diagnostics from this hospitalization (including imaging, microbiology, ancillary and laboratory) are listed below for reference.     Microbiology: Recent Results (from the past 240 hour(s))  MRSA Next Gen by PCR, Nasal     Status: None   Collection Time: 05/30/22  6:27 AM   Specimen: Nasal Mucosa; Nasal Swab  Result Value Ref Range Status   MRSA by PCR Next Gen NOT DETECTED NOT DETECTED Final    Comment: (NOTE) The GeneXpert MRSA Assay (FDA approved for NASAL specimens only), is one component of a comprehensive MRSA colonization surveillance program. It is not intended to diagnose MRSA infection nor to guide or monitor treatment for MRSA infections. Test  performance is not FDA approved in patients less than 51 years old. Performed at [facility], [address] [place], [place] [address]      Labs: BNP (last 3 results) No results for input(s): "BNP" in the last 8760 hours. Basic Metabolic Panel: Recent Labs  Lab 05/30/22 0049 05/31/22 0353  NA 139 138  K 3.9 4.0  CL 104 108  CO2 27 24  GLUCOSE 121* 109*  BUN 14 14  CREATININE 0.97 0.74  CALCIUM 9.3 8.5*  MG 2.2 2.2  PHOS 2.5  --    Liver Function Tests: Recent Labs  Lab 05/30/22 0049 05/31/22 0353  AST 23 15  ALT 16 13  ALKPHOS 50 42  BILITOT 0.7 0.9  PROT 7.6 5.8*  ALBUMIN 4.5 3.3*   Recent Labs  Lab 05/30/22 0049  LIPASE 25   No results for input(s): "AMMONIA" in the last 168 hours. CBC: Recent Labs  Lab 05/30/22 0049 05/31/22 0353  WBC 9.2 9.2  NEUTROABS 6.6  --   HGB 16.0 13.3  HCT 47.9 42.0  MCV 90.7 93.8  PLT 278 229   Cardiac Enzymes: No results for input(s): "CKTOTAL", "CKMB", "CKMBINDEX", "TROPONINI" in the last 168 hours. BNP: Invalid input(s): "POCBNP" CBG: No results for input(s): "GLUCAP" in the last 168 hours. D-Dimer No results for input(s): "DDIMER" in the last 72 hours. Hgb A1c No results for input(s): "HGBA1C" in  the last 72 hours. Lipid Profile No results for input(s): "CHOL", "HDL", "LDLCALC", "TRIG", "CHOLHDL", "LDLDIRECT" in the last 72 hours. Thyroid function studies No results for input(s): "TSH", "T4TOTAL", "T3FREE", "THYROIDAB" in the last 72 hours.  Invalid input(s): "FREET3" Anemia work up No results for input(s): "VITAMINB12", "FOLATE", "FERRITIN", "TIBC", "IRON", "RETICCTPCT" in the last 72 hours. Urinalysis    Component Value Date/Time   COLORURINE YELLOW 05/30/2022 0359   APPEARANCEUR CLEAR 05/30/2022 0359   LABSPEC 1.018 05/30/2022 0359   PHURINE 6.0 05/30/2022 0359   GLUCOSEU NEGATIVE 05/30/2022 0359   HGBUR NEGATIVE 05/30/2022 0359   BILIRUBINUR NEGATIVE 05/30/2022 0359   KETONESUR NEGATIVE  05/30/2022 0359   PROTEINUR NEGATIVE 05/30/2022 0359   UROBILINOGEN 0.2 03/18/2014 1522   NITRITE NEGATIVE 05/30/2022 0359   LEUKOCYTESUR NEGATIVE 05/30/2022 0359   Sepsis Labs Recent Labs  Lab 05/30/22 0049 05/31/22 0353  WBC 9.2 9.2   Microbiology Recent Results (from the past 240 hour(s))  MRSA Next Gen by PCR, Nasal     Status: None   Collection Time: 05/30/22  6:27 AM   Specimen: Nasal Mucosa; Nasal Swab  Result Value Ref Range Status   MRSA by PCR Next Gen NOT DETECTED NOT DETECTED Final    Comment: (NOTE) The GeneXpert MRSA Assay (FDA approved for NASAL specimens only), is one component of a comprehensive MRSA colonization surveillance program. It is not intended to diagnose MRSA infection nor to guide or monitor treatment for MRSA infections. Test performance is not FDA approved in patients less than 3 years old. Performed at [facility], [address] [place],  [address]      Time coordinating discharge: 35 minutes  SIGNED:   Aline August, MD  [facility] 06/01/2022, 8:01 AM

## 2022-06-05 DIAGNOSIS — L570 Actinic keratosis: Secondary | ICD-10-CM | POA: Diagnosis not present

## 2022-06-05 DIAGNOSIS — C4441 Basal cell carcinoma of skin of scalp and neck: Secondary | ICD-10-CM | POA: Diagnosis not present

## 2022-06-05 DIAGNOSIS — D485 Neoplasm of uncertain behavior of skin: Secondary | ICD-10-CM | POA: Diagnosis not present

## 2022-06-12 DIAGNOSIS — I25119 Atherosclerotic heart disease of native coronary artery with unspecified angina pectoris: Secondary | ICD-10-CM | POA: Diagnosis not present

## 2022-06-12 DIAGNOSIS — D692 Other nonthrombocytopenic purpura: Secondary | ICD-10-CM | POA: Diagnosis not present

## 2022-06-12 DIAGNOSIS — M19049 Primary osteoarthritis, unspecified hand: Secondary | ICD-10-CM | POA: Diagnosis not present

## 2022-06-12 DIAGNOSIS — K2100 Gastro-esophageal reflux disease with esophagitis, without bleeding: Secondary | ICD-10-CM | POA: Diagnosis not present

## 2022-06-12 DIAGNOSIS — E7849 Other hyperlipidemia: Secondary | ICD-10-CM | POA: Diagnosis not present

## 2022-06-12 DIAGNOSIS — Q446 Cystic disease of liver: Secondary | ICD-10-CM | POA: Diagnosis not present

## 2022-06-12 DIAGNOSIS — N281 Cyst of kidney, acquired: Secondary | ICD-10-CM | POA: Diagnosis not present

## 2022-06-12 DIAGNOSIS — I10 Essential (primary) hypertension: Secondary | ICD-10-CM | POA: Diagnosis not present

## 2022-06-13 DIAGNOSIS — C4441 Basal cell carcinoma of skin of scalp and neck: Secondary | ICD-10-CM | POA: Diagnosis not present

## 2022-06-26 DIAGNOSIS — Z961 Presence of intraocular lens: Secondary | ICD-10-CM | POA: Diagnosis not present

## 2022-06-26 DIAGNOSIS — H25812 Combined forms of age-related cataract, left eye: Secondary | ICD-10-CM | POA: Diagnosis not present

## 2022-06-26 DIAGNOSIS — H353131 Nonexudative age-related macular degeneration, bilateral, early dry stage: Secondary | ICD-10-CM | POA: Diagnosis not present

## 2022-07-01 DIAGNOSIS — K56609 Unspecified intestinal obstruction, unspecified as to partial versus complete obstruction: Secondary | ICD-10-CM | POA: Diagnosis not present

## 2022-07-11 DIAGNOSIS — H40013 Open angle with borderline findings, low risk, bilateral: Secondary | ICD-10-CM | POA: Diagnosis not present

## 2022-07-11 DIAGNOSIS — H25812 Combined forms of age-related cataract, left eye: Secondary | ICD-10-CM | POA: Diagnosis not present

## 2022-07-11 DIAGNOSIS — H01001 Unspecified blepharitis right upper eyelid: Secondary | ICD-10-CM | POA: Diagnosis not present

## 2022-07-11 DIAGNOSIS — H353132 Nonexudative age-related macular degeneration, bilateral, intermediate dry stage: Secondary | ICD-10-CM | POA: Diagnosis not present

## 2022-07-30 ENCOUNTER — Other Ambulatory Visit: Payer: Self-pay | Admitting: Cardiology

## 2022-08-02 ENCOUNTER — Encounter (HOSPITAL_COMMUNITY)
Admission: RE | Admit: 2022-08-02 | Discharge: 2022-08-02 | Disposition: A | Discharge status: Home / Self Care | Payer: Medicare Other | Source: Ambulatory Visit | Attending: Ophthalmology | Admitting: Ophthalmology

## 2022-08-02 ENCOUNTER — Other Ambulatory Visit: Payer: Self-pay

## 2022-08-02 ENCOUNTER — Encounter (HOSPITAL_COMMUNITY): Payer: Self-pay

## 2022-08-05 DIAGNOSIS — H25812 Combined forms of age-related cataract, left eye: Secondary | ICD-10-CM | POA: Diagnosis not present

## 2022-08-09 NOTE — H&P (Signed)
Surgical History & Physical  Patient Name: David Harmon DOB: [birth year]  Surgery: Cataract extraction with intraocular lens implant phacoemulsification; Left Eye  Surgeon: Baruch Goldmann MD Surgery Date:  08-12-22 Pre-Op Date:  07-25-22  HPI: A 32 Yr. old male patient Pt referred by Dr. Rosana Hoes for cataract evaluation last year. Pt had to cancel sx, ready for it now. 1. 1. The patient complains of difficulty when viewing TV, reading closed caption, news scrolls on TV, which began many years ago. Both eyes are affected. The episode is gradual. The condition's severity increased since last visit. Symptoms occur when the patient is inside and outside. This is negatively affecting the patient's quality of life and the patient is unable to function adequately in life with the current level of vision. HPI Completed by Dr. Baruch Goldmann  Medical History: Glaucoma Cataracts Pseudophakic OD Chronic Allergic Conjunctivitis OU Macula Degeneration Glaucoma High Blood Pressure LDL  Review of Systems Negative Allergic/Immunologic Negative Cardiovascular Negative Constitutional Negative Ear, Nose, Mouth & Throat Negative Endocrine Negative Eyes Negative Gastrointestinal Negative Genitourinary Negative Hemotologic/Lymphatic Negative Integumentary Negative Musculoskeletal Negative Neurological Negative Psychiatry Negative Respiratory  Social   Never smoked   Medication [place],  Amlodipine besylate, Pantoprazole, Atorvastatin, Lisinopril, Metoprolol, Clopidogrel, Magnesium, Aspirin, Arthritis relief,   Sx/Procedures Phaco c IOL,  Intestinal Blockage, Stent sx,   Drug Allergies   NKDA  History & Physical: Heent: Cataract, left eye NECK: supple without bruits LUNGS: lungs clear to auscultation CV: regular rate and rhythm Abdomen: soft and non-tender Impression & Plan: Assessment: 1.  COMBINED FORMS AGE RELATED CATARACT; Left Eye (H25.812) 2.  Age-related Macular Degeneration,  DRY; Both Eyes Intermediate (H35.3132) 3.  OAG BORDERLINE FINDINGS LOW RISK; Both Eyes (H40.013) 4.  BLEPHARITIS; Right Upper Lid, Right Lower Lid, Left Upper Lid, Left Lower Lid (H01.001, H01.002,H01.004,H01.005) 5.  DERMATOCHALASIS; Right Upper Lid, Left Upper Lid (H02.831, H02.834) 6.  Pinguecula; Both Eyes (H11.153)  Plan: 1.  Cataract accounts for the patient's decreased vision. This visual impairment is not correctable with a tolerable change in glasses or contact lenses. Cataract surgery with an implantation of a new lens should significantly improve the visual and functional status of the patient. Discussed all risks, benefits, alternatives, and potential complications. Discussed the procedures and recovery. Patient desires to have surgery. A-scan ordered and performed today for intra-ocular lens calculations. The surgery will be performed in order to improve vision for driving, reading, and for eye examinations. Recommend phacoemulsification with intra-ocular lens. Recommend Dextenza for post-operative pain and inflammation. Left Eye. Surgery required to correct imbalance of vision. Dilates well - shugarcaine by protocol.  2.  Multiple small drusen. OCT macula today. AREDS 2 vitamins. Amsler grid testing weekly. Call with any worsening vision, pain, or any other concerns.  3.  Detailed discussion about glaucoma today including importance of maintaining good follow up and following treatment plan, and the possibility of irreversible blindness as part of this disease process. OCT rNFL borderline OS 7/23. IOPs excellent.  4.  Recommend regular lid cleaning  5.  Asymptomatic, recommend observation for now. Findings, prognosis and treatment options reviewed.  6.  Observe; Artificial tears as needed for irritation.

## 2022-08-12 ENCOUNTER — Ambulatory Visit (HOSPITAL_COMMUNITY)
Admission: RE | Admit: 2022-08-12 | Discharge: 2022-08-12 | Disposition: A | Discharge status: Home / Self Care | Payer: Medicare Other | Attending: Ophthalmology | Admitting: Ophthalmology

## 2022-08-12 ENCOUNTER — Ambulatory Visit (HOSPITAL_BASED_OUTPATIENT_CLINIC_OR_DEPARTMENT_OTHER): Payer: Medicare Other | Admitting: Anesthesiology

## 2022-08-12 ENCOUNTER — Encounter (HOSPITAL_COMMUNITY)
Admission: RE | Disposition: A | Discharge status: Home / Self Care | Payer: Self-pay | Source: Home / Self Care | Attending: Ophthalmology

## 2022-08-12 ENCOUNTER — Encounter (HOSPITAL_COMMUNITY): Payer: Self-pay | Admitting: Ophthalmology

## 2022-08-12 ENCOUNTER — Ambulatory Visit (HOSPITAL_COMMUNITY): Payer: Medicare Other | Admitting: Anesthesiology

## 2022-08-12 DIAGNOSIS — H35369 Drusen (degenerative) of macula, unspecified eye: Secondary | ICD-10-CM | POA: Diagnosis not present

## 2022-08-12 DIAGNOSIS — Z79899 Other long term (current) drug therapy: Secondary | ICD-10-CM | POA: Insufficient documentation

## 2022-08-12 DIAGNOSIS — M1990 Unspecified osteoarthritis, unspecified site: Secondary | ICD-10-CM | POA: Diagnosis not present

## 2022-08-12 DIAGNOSIS — H0100A Unspecified blepharitis right eye, upper and lower eyelids: Secondary | ICD-10-CM | POA: Diagnosis not present

## 2022-08-12 DIAGNOSIS — H25812 Combined forms of age-related cataract, left eye: Secondary | ICD-10-CM | POA: Insufficient documentation

## 2022-08-12 DIAGNOSIS — I252 Old myocardial infarction: Secondary | ICD-10-CM | POA: Diagnosis not present

## 2022-08-12 DIAGNOSIS — Z87891 Personal history of nicotine dependence: Secondary | ICD-10-CM | POA: Insufficient documentation

## 2022-08-12 DIAGNOSIS — H353132 Nonexudative age-related macular degeneration, bilateral, intermediate dry stage: Secondary | ICD-10-CM | POA: Diagnosis not present

## 2022-08-12 DIAGNOSIS — H11153 Pinguecula, bilateral: Secondary | ICD-10-CM | POA: Diagnosis not present

## 2022-08-12 DIAGNOSIS — H0100B Unspecified blepharitis left eye, upper and lower eyelids: Secondary | ICD-10-CM | POA: Diagnosis not present

## 2022-08-12 DIAGNOSIS — H02831 Dermatochalasis of right upper eyelid: Secondary | ICD-10-CM | POA: Insufficient documentation

## 2022-08-12 DIAGNOSIS — I10 Essential (primary) hypertension: Secondary | ICD-10-CM | POA: Diagnosis not present

## 2022-08-12 DIAGNOSIS — H40013 Open angle with borderline findings, low risk, bilateral: Secondary | ICD-10-CM | POA: Diagnosis not present

## 2022-08-12 DIAGNOSIS — I2510 Atherosclerotic heart disease of native coronary artery without angina pectoris: Secondary | ICD-10-CM | POA: Diagnosis not present

## 2022-08-12 DIAGNOSIS — H02834 Dermatochalasis of left upper eyelid: Secondary | ICD-10-CM | POA: Diagnosis not present

## 2022-08-12 HISTORY — PX: CATARACT EXTRACTION W/PHACO: SHX586

## 2022-08-12 SURGERY — PHACOEMULSIFICATION, CATARACT, WITH IOL INSERTION
Anesthesia: Monitor Anesthesia Care | Site: Eye | Laterality: Left

## 2022-08-12 MED ORDER — SODIUM CHLORIDE 0.9% FLUSH
INTRAVENOUS | Status: DC | PRN
  Administered 2022-08-12: 10 mL via INTRAVENOUS

## 2022-08-12 MED ORDER — STERILE WATER FOR IRRIGATION IR SOLN
Status: DC | PRN
  Administered 2022-08-12: 500 mL

## 2022-08-12 MED ORDER — NEOMYCIN-POLYMYXIN-DEXAMETH 3.5-10000-0.1 OP SUSP
OPHTHALMIC | Status: DC | PRN
  Administered 2022-08-12: 2 [drp] via OPHTHALMIC

## 2022-08-12 MED ORDER — TETRACAINE HCL 0.5 % OP SOLN
1.0000 [drp] | OPHTHALMIC | Status: AC | PRN
Start: 2022-08-12 — End: 2022-08-12
  Administered 2022-08-12 (×3): 1 [drp] via OPHTHALMIC

## 2022-08-12 MED ORDER — MIDAZOLAM HCL 5 MG/5ML IJ SOLN
INTRAMUSCULAR | Status: DC | PRN
  Administered 2022-08-12: 1 mg via INTRAVENOUS

## 2022-08-12 MED ORDER — EPINEPHRINE PF 1 MG/ML IJ SOLN
INTRAMUSCULAR | Status: AC
  Filled 2022-08-12: qty 2

## 2022-08-12 MED ORDER — SODIUM HYALURONATE 23MG/ML IO SOSY
PREFILLED_SYRINGE | INTRAOCULAR | Status: DC | PRN
  Administered 2022-08-12: 0.6 mL via INTRAOCULAR

## 2022-08-12 MED ORDER — LIDOCAINE HCL 3.5 % OP GEL
1.0000 | Freq: Once | OPHTHALMIC | Status: AC
  Administered 2022-08-12: 1 via OPHTHALMIC

## 2022-08-12 MED ORDER — BSS IO SOLN
INTRAOCULAR | Status: DC | PRN
  Administered 2022-08-12: 15 mL via INTRAOCULAR

## 2022-08-12 MED ORDER — SODIUM HYALURONATE 10 MG/ML IO SOLUTION
PREFILLED_SYRINGE | INTRAOCULAR | Status: DC | PRN
  Administered 2022-08-12: 0.85 mL via INTRAOCULAR

## 2022-08-12 MED ORDER — PHENYLEPHRINE HCL 2.5 % OP SOLN
1.0000 [drp] | OPHTHALMIC | Status: AC | PRN
  Administered 2022-08-12 (×3): 1 [drp] via OPHTHALMIC

## 2022-08-12 MED ORDER — POVIDONE-IODINE 5 % OP SOLN
OPHTHALMIC | Status: DC | PRN
  Administered 2022-08-12: 1 via OPHTHALMIC

## 2022-08-12 MED ORDER — LIDOCAINE HCL (PF) 1 % IJ SOLN
INTRAOCULAR | Status: DC | PRN
  Administered 2022-08-12: 1 mL via OPHTHALMIC

## 2022-08-12 MED ORDER — EPINEPHRINE PF 1 MG/ML IJ SOLN
INTRAOCULAR | Status: DC | PRN
  Administered 2022-08-12: 500 mL

## 2022-08-12 MED ORDER — TROPICAMIDE 1 % OP SOLN
1.0000 [drp] | OPHTHALMIC | Status: AC | PRN
  Administered 2022-08-12 (×3): 1 [drp] via OPHTHALMIC

## 2022-08-12 SURGICAL SUPPLY — 18 items
CATARACT SUITE SIGHTPATH (MISCELLANEOUS) ×1 IMPLANT
CLOTH BEACON ORANGE TIMEOUT ST (SAFETY) ×1 IMPLANT
EYE SHIELD UNIVERSAL CLEAR (GAUZE/BANDAGES/DRESSINGS) IMPLANT
FEE CATARACT SUITE SIGHTPATH (MISCELLANEOUS) ×1 IMPLANT
GLOVE BIOGEL PI IND STRL 6.5 (GLOVE) IMPLANT
GLOVE BIOGEL PI IND STRL 7.0 (GLOVE) ×2 IMPLANT
GLOVE BIOGEL PI INDICATOR 6.5 (GLOVE) ×1
GLOVE BIOGEL PI INDICATOR 7.0 (GLOVE) ×1
GLOVE ECLIPSE 6.0 STRL STRAW (GLOVE) IMPLANT
LENS IOL DIOP 21.5 (Intraocular Lens) ×1 IMPLANT
LENS IOL TECNIS MONO 21.5 (Intraocular Lens) IMPLANT
NDL HYPO 18GX1.5 BLUNT FILL (NEEDLE) IMPLANT
NEEDLE HYPO 18GX1.5 BLUNT FILL (NEEDLE) ×1 IMPLANT
PAD ARMBOARD 7.5X6 YLW CONV (MISCELLANEOUS) ×1 IMPLANT
SYR TB 1ML LL NO SAFETY (SYRINGE) ×1 IMPLANT
TAPE SURG TRANSPORE 1 IN (GAUZE/BANDAGES/DRESSINGS) IMPLANT
TAPE SURGICAL TRANSPORE 1 IN (GAUZE/BANDAGES/DRESSINGS) ×1
WATER STERILE IRR 500ML POUR (IV SOLUTION) IMPLANT

## 2022-08-12 NOTE — Discharge Instructions (Addendum)
Please discharge patient when stable, will follow up today with Dr. Wrzosek at the [facility] [place] office immediately following discharge.  Leave shield in place until visit.  All paperwork with discharge instructions will be given at the office.  [facility] Address:  [address]  [place], [place] [address]  

## 2022-08-12 NOTE — Addendum Note (Signed)
Addendum  created 08/12/22 1352 by Ollen Bowl, CRNA   Intraprocedure Event edited

## 2022-08-12 NOTE — Op Note (Signed)
Date of procedure: 08/12/22  Pre-operative diagnosis: Visually significant age-related combined cataract, Left Eye (H25.812)  Post-operative diagnosis: Visually significant age-related combined cataract, Left Eye (H25.812)  Procedure: Removal of cataract via phacoemulsification and insertion of intra-ocular lens Wynetta Emery and Johnson DCB00 +21.5D into the capsular bag of the Left Eye  Attending surgeon: Gerda Diss. Magenta Schmiesing, MD, MA  Anesthesia: MAC, Topical Akten  Complications: None  Estimated Blood Loss: <2m (minimal)  Specimens: None  Implants: As above  Indications:  Visually significant age-related cataract, Left Eye  Procedure:  The patient was seen and identified in the pre-operative area. The operative eye was identified and dilated.  The operative eye was marked.  Topical anesthesia was administered to the operative eye.     The patient was then to the operative suite and placed in the supine position.  A timeout was performed confirming the patient, procedure to be performed, and all other relevant information.   The patient's face was prepped and draped in the usual fashion for intra-ocular surgery.  A lid speculum was placed into the operative eye and the surgical microscope moved into place and focused.  An inferotemporal paracentesis was created using a 20 gauge paracentesis blade.  Shugarcaine was injected into the anterior chamber.  Viscoelastic was injected into the anterior chamber.  A temporal clear-corneal main wound incision was created using a 2.457mmicrokeratome.  A continuous curvilinear capsulorrhexis was initiated using an irrigating cystitome and completed using capsulorrhexis forceps.  Hydrodissection and hydrodeliniation were performed.  Viscoelastic was injected into the anterior chamber.  A phacoemulsification handpiece and a chopper as a second instrument were used to remove the nucleus and epinucleus. The irrigation/aspiration handpiece was used to remove any  remaining cortical material.   The capsular bag was reinflated with viscoelastic, checked, and found to be intact.  The intraocular lens was inserted into the capsular bag.  The irrigation/aspiration handpiece was used to remove any remaining viscoelastic.  The clear corneal wound and paracentesis wounds were then hydrated and checked with Weck-Cels to be watertight.  Maxitrol was instilled in the eye. The lid-speculum was removed.  The drape was removed.  The patient's face was cleaned with a wet and dry 4x4.    A clear shield was taped over the eye. The patient was taken to the post-operative care unit in good condition, having tolerated the procedure well.  Post-Op Instructions: [facility] and will receive all other orders and instructions.

## 2022-08-12 NOTE — Anesthesia Preprocedure Evaluation (Signed)
Anesthesia Evaluation  Patient identified by MRN, date of birth, ID band Patient awake    Reviewed: Allergy & Precautions, NPO status , Patient's Chart, lab work & pertinent test results, reviewed documented beta blocker date and time   Airway Mallampati: I  TM Distance: >3 FB     Dental  (+) Teeth Intact   Pulmonary former smoker,    breath sounds clear to auscultation       Cardiovascular hypertension, Pt. on home beta blockers and Pt. on medications (-) angina+ CAD and + Past MI   Rhythm:Regular Rate:Normal     Neuro/Psych    GI/Hepatic negative GI ROS, Neg liver ROS,   Endo/Other  negative endocrine ROS  Renal/GU negative Renal ROS     Musculoskeletal  (+) Arthritis ,   Abdominal   Peds  Hematology negative hematology ROS (+)   Anesthesia Other Findings   Reproductive/Obstetrics                             Anesthesia Physical  Anesthesia Plan  ASA: III  Anesthesia Plan: MAC   Post-op Pain Management:    Induction: Intravenous  PONV Risk Score and Plan:   Airway Management Planned: Nasal Cannula  Additional Equipment:   Intra-op Plan:   Post-operative Plan:   Informed Consent: I have reviewed the patients History and Physical, chart, labs and discussed the procedure including the risks, benefits and alternatives for the proposed anesthesia with the patient or authorized representative who has indicated his/her understanding and acceptance.       Plan Discussed with:   Anesthesia Plan Comments:         Anesthesia Quick Evaluation

## 2022-08-12 NOTE — Transfer of Care (Signed)
Immediate Anesthesia Transfer of Care Note  Patient: David Harmon  Procedure(s) Performed: CATARACT EXTRACTION PHACO AND INTRAOCULAR LENS PLACEMENT (IOC) (Left: Eye)  Patient Location: Short Stay  Anesthesia Type:MAC  Level of Consciousness: awake  Airway & Oxygen Therapy: Patient Spontanous Breathing  Post-op Assessment: Post -op Vital signs reviewed and stable  Post vital signs: [phone]  Last Vitals:  Vitals Value Taken Time  BP 169/78   Temp    Pulse 64   Resp    SpO2      Last Pain:  Vitals:   08/12/22 1052  TempSrc: Oral  PainSc: 0-No pain         Complications: No notable events documented.

## 2022-08-12 NOTE — Anesthesia Postprocedure Evaluation (Signed)
Anesthesia Post Note  Patient: David Harmon  Procedure(s) Performed: CATARACT EXTRACTION PHACO AND INTRAOCULAR LENS PLACEMENT (IOC) (Left: Eye)  Patient location during evaluation: Short Stay Anesthesia Type: MAC Level of consciousness: awake and alert Pain management: pain level controlled Vital Signs Assessment: post-procedure vital signs reviewed and stable Respiratory status: spontaneous breathing Cardiovascular status: blood pressure returned to baseline and stable Postop Assessment: no apparent nausea or vomiting Anesthetic complications: no   No notable events documented.   Last Vitals:  Vitals:   08/12/22 1052 08/12/22 1100  BP: (!) 183/76 (!) 169/78  Pulse: (!) 58   Resp: 18   Temp: 36.6 C   SpO2: 100%     Last Pain:  Vitals:   08/12/22 1052  TempSrc: Oral  PainSc: 0-No pain                 Louann Sjogren

## 2022-08-12 NOTE — Interval H&P Note (Signed)
History and Physical Interval Note:  08/12/2022 10:58 AM  David Harmon  has presented today for surgery, with the diagnosis of combined forms age related cataract; left.  The various methods of treatment have been discussed with the patient and family. After consideration of risks, benefits and other options for treatment, the patient has consented to  Procedure(s) with comments: CATARACT EXTRACTION PHACO AND INTRAOCULAR LENS PLACEMENT ([place]) (Left) - left as a surgical intervention.  The patient's history has been reviewed, patient examined, no change in status, stable for surgery.  I have reviewed the patient's chart and labs.  Questions were answered to the patient's satisfaction.     Baruch Goldmann

## 2022-08-15 ENCOUNTER — Encounter (HOSPITAL_COMMUNITY): Payer: Self-pay | Admitting: Ophthalmology

## 2022-10-07 ENCOUNTER — Other Ambulatory Visit: Payer: Self-pay | Admitting: Cardiology

## 2022-10-07 DIAGNOSIS — E7849 Other hyperlipidemia: Secondary | ICD-10-CM | POA: Diagnosis not present

## 2022-10-07 DIAGNOSIS — Q446 Cystic disease of liver: Secondary | ICD-10-CM | POA: Diagnosis not present

## 2022-10-07 DIAGNOSIS — Z1329 Encounter for screening for other suspected endocrine disorder: Secondary | ICD-10-CM | POA: Diagnosis not present

## 2022-10-07 DIAGNOSIS — E782 Mixed hyperlipidemia: Secondary | ICD-10-CM | POA: Diagnosis not present

## 2022-10-07 DIAGNOSIS — N183 Chronic kidney disease, stage 3 unspecified: Secondary | ICD-10-CM | POA: Diagnosis not present

## 2022-10-07 DIAGNOSIS — K219 Gastro-esophageal reflux disease without esophagitis: Secondary | ICD-10-CM | POA: Diagnosis not present

## 2022-10-09 DIAGNOSIS — D0461 Carcinoma in situ of skin of right upper limb, including shoulder: Secondary | ICD-10-CM | POA: Diagnosis not present

## 2022-10-09 DIAGNOSIS — L570 Actinic keratosis: Secondary | ICD-10-CM | POA: Diagnosis not present

## 2022-10-09 DIAGNOSIS — D485 Neoplasm of uncertain behavior of skin: Secondary | ICD-10-CM | POA: Diagnosis not present

## 2022-10-15 DIAGNOSIS — L570 Actinic keratosis: Secondary | ICD-10-CM | POA: Diagnosis not present

## 2022-10-15 DIAGNOSIS — E7849 Other hyperlipidemia: Secondary | ICD-10-CM | POA: Diagnosis not present

## 2022-10-15 DIAGNOSIS — N281 Cyst of kidney, acquired: Secondary | ICD-10-CM | POA: Diagnosis not present

## 2022-10-15 DIAGNOSIS — D692 Other nonthrombocytopenic purpura: Secondary | ICD-10-CM | POA: Diagnosis not present

## 2022-10-15 DIAGNOSIS — Z23 Encounter for immunization: Secondary | ICD-10-CM | POA: Diagnosis not present

## 2022-10-15 DIAGNOSIS — I10 Essential (primary) hypertension: Secondary | ICD-10-CM | POA: Diagnosis not present

## 2022-10-15 DIAGNOSIS — M19049 Primary osteoarthritis, unspecified hand: Secondary | ICD-10-CM | POA: Diagnosis not present

## 2022-10-15 DIAGNOSIS — R030 Elevated blood-pressure reading, without diagnosis of hypertension: Secondary | ICD-10-CM | POA: Diagnosis not present

## 2022-10-15 DIAGNOSIS — Q446 Cystic disease of liver: Secondary | ICD-10-CM | POA: Diagnosis not present

## 2022-10-15 DIAGNOSIS — I25119 Atherosclerotic heart disease of native coronary artery with unspecified angina pectoris: Secondary | ICD-10-CM | POA: Diagnosis not present

## 2022-10-15 DIAGNOSIS — Z0001 Encounter for general adult medical examination with abnormal findings: Secondary | ICD-10-CM | POA: Diagnosis not present

## 2022-10-15 DIAGNOSIS — K2100 Gastro-esophageal reflux disease with esophagitis, without bleeding: Secondary | ICD-10-CM | POA: Diagnosis not present

## 2022-10-16 ENCOUNTER — Encounter: Payer: Self-pay | Admitting: Internal Medicine

## 2022-10-16 DIAGNOSIS — C44622 Squamous cell carcinoma of skin of right upper limb, including shoulder: Secondary | ICD-10-CM | POA: Diagnosis not present

## 2022-10-16 DIAGNOSIS — D0461 Carcinoma in situ of skin of right upper limb, including shoulder: Secondary | ICD-10-CM | POA: Diagnosis not present

## 2022-11-14 DIAGNOSIS — H01001 Unspecified blepharitis right upper eyelid: Secondary | ICD-10-CM | POA: Diagnosis not present

## 2022-11-14 DIAGNOSIS — H40013 Open angle with borderline findings, low risk, bilateral: Secondary | ICD-10-CM | POA: Diagnosis not present

## 2022-11-14 DIAGNOSIS — H353132 Nonexudative age-related macular degeneration, bilateral, intermediate dry stage: Secondary | ICD-10-CM | POA: Diagnosis not present

## 2022-11-14 DIAGNOSIS — H01002 Unspecified blepharitis right lower eyelid: Secondary | ICD-10-CM | POA: Diagnosis not present

## 2022-11-18 NOTE — Progress Notes (Unsigned)
GI Office Note    Referring Provider: Caryl Bis, MD Primary Care Physician:  Caryl Bis, MD  Primary Gastroenterologist: Elon Alas. Abbey Chatters, DO  Chief Complaint   No chief complaint on file.   History of Present Illness   [name] is a 81 y.o. male presenting today at the request of Caryl Bis, MD for ***abdominal pain.  Hospitalization in June 2023 for abdominal pain and found to have infectious vs inflammatory enteritis with small bowel obstruction on CT A/P.  General surgery consulted, he was treated with IV fluids, antiemetics, and analgesics, and NGT.  Diet slowly advanced which he was able to tolerate.  Surgery not required.  Hospitalization notes a history of GERD, HTN, HLD, CAD s/p stent placement and NSTEMI.  History noting ex lap in 2007 for small bowel obstruction.   No prior endoscopies on file.   Today:     Current Outpatient Medications  Medication Sig Dispense Refill   acetaminophen (TYLENOL) 500 MG tablet Take 1,000 mg by mouth every 6 (six) hours as needed for mild pain or moderate pain.     aspirin EC 81 MG tablet Take 81 mg by mouth daily.     atorvastatin (LIPITOR) 40 MG tablet Take 40 mg by mouth daily.     clopidogrel (PLAVIX) 75 MG tablet TAKE 1 TABLET(75 MG) BY MOUTH DAILY 90 tablet 1   lisinopril-hydrochlorothiazide (PRINZIDE,ZESTORETIC) 20-12.5 MG per tablet Take 1 tablet by mouth daily.      Magnesium 500 MG TABS Take 1 tablet by mouth daily.     metoprolol succinate (TOPROL-XL) 25 MG 24 hr tablet TAKE 1 TABLET(25 MG) BY MOUTH DAILY 90 tablet 3   Multiple Vitamins-Minerals (DAILY MENS HEALTH FORMULA PO) Take 1 tablet by mouth daily.     pantoprazole (PROTONIX) 40 MG tablet Take 1 tablet (40 mg total) by mouth daily. (Patient taking differently: Take 40 mg by mouth daily as needed.) 30 tablet 1   spironolactone (ALDACTONE) 25 MG tablet Take 25 mg by mouth daily.     No current facility-administered medications for this  visit.    Past Medical History:  Diagnosis Date   Arthritis    Coronary atherosclerosis of native coronary artery    DES RCA 11/09 (Dr. Terrence Dupont)   Essential hypertension    History of kidney stones    Mixed hyperlipidemia    Myocardial infarction [facility])    NSTEMI 11/09    Past Surgical History:  Procedure Laterality Date   CARDIAC CATHETERIZATION     with stent   CATARACT EXTRACTION W/PHACO Right 07/28/2017   Procedure: CATARACT EXTRACTION PHACO AND INTRAOCULAR LENS PLACEMENT RIGHT EYE;  Surgeon: Tonny Branch, MD;  Location: [facility];  Service: Ophthalmology;  Laterality: Right;  CDE: 8.33   CATARACT EXTRACTION W/PHACO Left 08/12/2022   Procedure: CATARACT EXTRACTION PHACO AND INTRAOCULAR LENS PLACEMENT (IOC);  Surgeon: Baruch Goldmann, MD;  Location: [facility];  Service: Ophthalmology;  Laterality: Left;  CDE 9.75   EXPLORATORY LAPAROTOMY  2007   Small bowel obstruction   Resection of pilonidal cyst      Family History  Problem Relation Age of Onset   Coronary artery disease Father    Coronary artery disease Mother    Diabetes type II Sister     Allergies as of 11/19/2022   (No Known Allergies)    Social History   Socioeconomic History   Marital status: Widowed    Spouse name: Not on file  Number of children: Not on file   Years of education: Not on file   Highest education level: Not on file  Occupational History   Occupation: Truck driver  Tobacco Use   Smoking status: Former    Packs/day: 0.50    Years: 10.00    Total pack years: 5.00    Types: Cigarettes    Quit date: 12/16/1965    Years since quitting: 56.9   Smokeless tobacco: Never   Tobacco comments:    Has not smoked since 41 yrs.  Vaping Use   Vaping Use: Never used  Substance and Sexual Activity   Alcohol use: No    Alcohol/week: 0.0 standard drinks of alcohol   Drug use: No   Sexual activity: Yes    Birth control/protection: None  Other Topics Concern   Not on file  Social History Narrative    Not on file   Social Determinants of Health   Financial Resource Strain: Not on file  Food Insecurity: Not on file  Transportation Needs: Not on file  Physical Activity: Not on file  Stress: Not on file  Social Connections: Not on file  Intimate Partner Violence: Not on file     Review of Systems   Gen: Denies any fever, chills, fatigue, weight loss, lack of appetite.  CV: Denies chest pain, heart palpitations, peripheral edema, syncope.  Resp: Denies shortness of breath at rest or with exertion. Denies wheezing or cough.  GI: see HPI GU : Denies urinary burning, urinary frequency, urinary hesitancy MS: Denies joint pain, muscle weakness, cramps, or limitation of movement.  Derm: Denies rash, itching, dry skin Psych: Denies depression, anxiety, memory loss, and confusion Heme: Denies bruising, bleeding, and enlarged lymph nodes.   Physical Exam   There were no vitals taken for this visit.  General:   Alert and oriented. Pleasant and cooperative. Well-nourished and well-developed.  Head:  Normocephalic and atraumatic. Eyes:  Without icterus, sclera clear and conjunctiva pink.  Ears:  Normal auditory acuity. Mouth:  No deformity or lesions, oral mucosa pink.  Lungs:  Clear to auscultation bilaterally. No wheezes, rales, or rhonchi. No distress.  Heart:  S1, S2 present without murmurs appreciated.  Abdomen:  +BS, soft, non-tender and non-distended. No HSM noted. No guarding or rebound. No masses appreciated.  Rectal:  Deferred *** Msk:  Symmetrical without gross deformities. Normal posture. Extremities:  Without edema. Neurologic:  Alert and  oriented x4;  grossly normal neurologically. Skin:  Intact without significant lesions or rashes. Psych:  Alert and cooperative. Normal mood and affect.   Assessment   David Harmon is a 81 y.o. male with a history of GERD, HTN, HLD, CAD s/p stent placement and NSTEMI for evaluation of abdominal pain.   Abdominal pain, ***:  Prior smal bowel obstruction in June 2023, treated conservatively. History of ex lap in 2007 for the same.    PLAN   ***    Venetia Night, MSN, FNP-BC, AGACNP-BC [facility]

## 2022-11-19 ENCOUNTER — Encounter: Payer: Self-pay | Admitting: Gastroenterology

## 2022-11-19 ENCOUNTER — Ambulatory Visit (INDEPENDENT_AMBULATORY_CARE_PROVIDER_SITE_OTHER): Payer: Medicare Other | Admitting: Gastroenterology

## 2022-11-19 VITALS — BP 175/85 | HR 64 | Temp 98.4°F | Ht 70.0 in | Wt 176.0 lb

## 2022-11-19 DIAGNOSIS — K219 Gastro-esophageal reflux disease without esophagitis: Secondary | ICD-10-CM

## 2022-11-19 DIAGNOSIS — Z8719 Personal history of other diseases of the digestive system: Secondary | ICD-10-CM | POA: Diagnosis not present

## 2022-11-19 DIAGNOSIS — I10 Essential (primary) hypertension: Secondary | ICD-10-CM

## 2022-11-19 DIAGNOSIS — R1010 Upper abdominal pain, unspecified: Secondary | ICD-10-CM | POA: Diagnosis not present

## 2022-11-19 NOTE — Patient Instructions (Signed)
For history of GERD, I recommend that you only take pantoprazole 40 mg once daily instead of twice daily.  Continue your current nutrition regimen with boiling, baking foods and avoiding fried/fatty foods and spicy foods.  I want you to wear an abdominal binder if you are doing a lot of bending over, lifting, or pushing heavy objects.  Even when out in the yard doing yard work, this would be a good idea to add support to your core.  Given your history of prior repair, you may have some weak abdominal muscles that are getting sore at times.  You may continue to take Tylenol as needed when this occurs.  It was a pleasure to meet you today!  We have a wonderful Christmas and a happy new year!  You may follow-up with [place] as needed, we will place you on recall for 1 year.  If you have any new symptoms or your pain becomes more severe or you have worsening reflux, please feel free to reach out for an appointment.  It was a pleasure to see you today. I want to create trusting relationships with patients. If you receive a survey regarding your visit,  I greatly appreciate you taking time to fill this out on paper or through your MyChart. I value your feedback.  Venetia Night, MSN, FNP-BC, AGACNP-BC [facility]

## 2022-11-21 ENCOUNTER — Ambulatory Visit (INDEPENDENT_AMBULATORY_CARE_PROVIDER_SITE_OTHER): Payer: Medicare Other

## 2022-11-21 ENCOUNTER — Ambulatory Visit (INDEPENDENT_AMBULATORY_CARE_PROVIDER_SITE_OTHER): Payer: Medicare Other | Admitting: Orthopaedic Surgery

## 2022-11-21 ENCOUNTER — Encounter: Payer: Self-pay | Admitting: Orthopaedic Surgery

## 2022-11-21 VITALS — Ht 70.0 in | Wt 175.0 lb

## 2022-11-21 DIAGNOSIS — M5136 Other intervertebral disc degeneration, lumbar region: Secondary | ICD-10-CM

## 2022-11-21 DIAGNOSIS — M79605 Pain in left leg: Secondary | ICD-10-CM | POA: Diagnosis not present

## 2022-11-21 DIAGNOSIS — M25552 Pain in left hip: Secondary | ICD-10-CM

## 2022-11-21 DIAGNOSIS — M51369 Other intervertebral disc degeneration, lumbar region without mention of lumbar back pain or lower extremity pain: Secondary | ICD-10-CM | POA: Insufficient documentation

## 2022-11-21 NOTE — Progress Notes (Signed)
Office Visit Note   Patient: David Harmon           Date of Birth: Jul 05, 1941           MRN: [id number] Visit Date: 11/21/2022              Requested by: Caryl Bis, MD [place],  [place] [address] PCP: Caryl Bis, MD   Assessment & Plan: Visit Diagnoses:  1. Pain in left hip   2. Pain in left leg     Plan: Patient has no pain sitting position.  Increased discomfort with turning and twisting he will take it easy we reviewed x-rays and discussed with him this is likely climbing from some lateral recess or foraminal narrowing with his multilevel lumbar disc degeneration.  He has increased symptoms we could send in a Medrol Dosepak currently is not having enough symptoms consider proceeding with this.  Follow-Up Instructions: Return in about 6 weeks (around 01/02/2023).   Orders:  Orders Placed This Encounter  Procedures   XR HIP UNILAT W OR W/O PELVIS 2-3 VIEWS LEFT   XR Lumbar Spine 2-3 Views   No orders of the defined types were placed in this encounter.     Procedures: No procedures performed   Clinical Data: No additional findings.   Subjective: Chief Complaint  Patient presents with   Left Hip - Pain    HPI 81 year old male was helping a friend install garage door but states he is mostly observing and bent over had some increased pain in his back left buttocks and his left leg anteriorly to his knee had pain medially in the groin.  He has pain and has trouble bending the leg.  He has been walking slightly crooked and was concerned he might have some hip arthritis.  Past history of non-STEMI.  Hypertension.  He has been active healthy and in good physical shape, otherwise.  He does take Lipitor Plavix as well as blood pressure medication.  Review of Systems All the systems noncontributory HPI.  Objective: Vital Signs: Ht '5\' 10"'$  (1.778 m)   Wt 175 lb (79.4 kg)   BMI 25.11 kg/m   Physical Exam Constitutional:      Appearance: He is  well-developed.  HENT:     Head: Normocephalic and atraumatic.     Right Ear: External ear normal.     Left Ear: External ear normal.  Eyes:     Pupils: Pupils are equal, round, and reactive to light.  Neck:     Thyroid: No thyromegaly.     Trachea: No tracheal deviation.  Cardiovascular:     Rate and Rhythm: Normal rate.  Pulmonary:     Effort: Pulmonary effort is normal.     Breath sounds: No wheezing.  Abdominal:     General: Bowel sounds are normal.     Palpations: Abdomen is soft.  Musculoskeletal:     Cervical back: Neck supple.  Skin:    General: Skin is warm and dry.     Capillary Refill: Capillary refill takes less than 2 seconds.  Neurological:     Mental Status: He is alert and oriented to person, place, and time.  Psychiatric:        Behavior: Behavior normal.        Thought Content: Thought content normal.        Judgment: Judgment normal.     Ortho Exam negative straight leg raising 90 degrees negative popliteal compression test.  Slight decreased internal rotation of both hips but nonpainful.  Left greater trochanter nontender sciatic notch is tender painful with palpation.  Some reproduction of pain with resisted hip flexion.  Specialty Comments:  No specialty comments available.  Imaging: XR Lumbar Spine 2-3 Views  Result Date: 11/21/2022 [facility] lateral lumbar spine images demonstrate right lumbar curvature disc degeneration is asymmetrical with left-sided endplate spurring asymmetric collapse more pronounced at L4-5 with 2 mm anterolisthesis.  Narrowing L5-S1.  Negative for acute compression fracture.  Spurring noted at thoracolumbar junction as well as L1-2 and L2-3. Impression: Lumbar disc degeneration multilevel with varying degrees of spurring and narrowing with some curvature.  XR HIP UNILAT W OR W/O PELVIS 2-3 VIEWS LEFT  Result Date: 11/21/2022 Standing AP pelvis frog-leg left hip pain reviewed this shows maintenance of his space negative for acute  bone changes no joint space narrowing or spurring.  Sacroiliac joints appear normal. Impression: Negative left hip radiographs.    PMFS History: Patient Active Problem List   Diagnosis Date Noted   Small bowel obstruction ([place]) 05/30/2022   Abdominal pain 05/30/2022   Nausea & vomiting 05/30/2022   NSTEMI (non-ST elevated myocardial infarction) ([place]) 05/30/2022   GERD (gastroesophageal reflux disease) 05/30/2022   Chest pain 07/31/2017   Coronary atherosclerosis of native coronary artery 07/10/2011   Mixed hyperlipidemia 07/10/2011   Essential hypertension, benign 07/10/2011   Past Medical History:  Diagnosis Date   Arthritis    Coronary atherosclerosis of native coronary artery    DES RCA 11/09 (Dr. Terrence Dupont)   Essential hypertension    History of kidney stones    Mixed hyperlipidemia    Myocardial infarction ([place])    NSTEMI 11/09    Family History  Problem Relation Age of Onset   Coronary artery disease Father    Coronary artery disease Mother    Diabetes type II Sister     Past Surgical History:  Procedure Laterality Date   CARDIAC CATHETERIZATION     with stent   CATARACT EXTRACTION W/PHACO Right 07/28/2017   Procedure: CATARACT EXTRACTION PHACO AND INTRAOCULAR LENS PLACEMENT RIGHT EYE;  Surgeon: Tonny Branch, MD;  Location: [facility];  Service: Ophthalmology;  Laterality: Right;  CDE: 8.33   CATARACT EXTRACTION W/PHACO Left 08/12/2022   Procedure: CATARACT EXTRACTION PHACO AND INTRAOCULAR LENS PLACEMENT (IOC);  Surgeon: Baruch Goldmann, MD;  Location: [facility];  Service: Ophthalmology;  Laterality: Left;  CDE 9.75   EXPLORATORY LAPAROTOMY  2007   Small bowel obstruction   Resection of pilonidal cyst     Social History   Occupational History   Occupation: Truck driver  Tobacco Use   Smoking status: Former    Packs/day: 0.50    Years: 10.00    Total pack years: 5.00    Types: Cigarettes    Quit date: 12/16/1965    Years since quitting: 56.9   Smokeless tobacco: Never    Tobacco comments:    Has not smoked since 41 yrs.  Vaping Use   Vaping Use: Never used  Substance and Sexual Activity   Alcohol use: No    Alcohol/week: 0.0 standard drinks of alcohol   Drug use: No   Sexual activity: Yes    Birth control/protection: None

## 2022-12-03 DIAGNOSIS — B86 Scabies: Secondary | ICD-10-CM | POA: Diagnosis not present

## 2022-12-11 DIAGNOSIS — H02051 Trichiasis without entropian right upper eyelid: Secondary | ICD-10-CM | POA: Diagnosis not present

## 2022-12-11 DIAGNOSIS — H1033 Unspecified acute conjunctivitis, bilateral: Secondary | ICD-10-CM | POA: Diagnosis not present

## 2022-12-24 DIAGNOSIS — H353131 Nonexudative age-related macular degeneration, bilateral, early dry stage: Secondary | ICD-10-CM | POA: Diagnosis not present

## 2022-12-30 DIAGNOSIS — R059 Cough, unspecified: Secondary | ICD-10-CM | POA: Diagnosis not present

## 2023-01-02 ENCOUNTER — Ambulatory Visit: Payer: Medicare Other | Admitting: Orthopaedic Surgery

## 2023-01-24 ENCOUNTER — Other Ambulatory Visit: Payer: Self-pay | Admitting: Cardiology

## 2023-02-06 DIAGNOSIS — K2100 Gastro-esophageal reflux disease with esophagitis, without bleeding: Secondary | ICD-10-CM | POA: Diagnosis not present

## 2023-02-06 DIAGNOSIS — E7849 Other hyperlipidemia: Secondary | ICD-10-CM | POA: Diagnosis not present

## 2023-02-06 DIAGNOSIS — Z1329 Encounter for screening for other suspected endocrine disorder: Secondary | ICD-10-CM | POA: Diagnosis not present

## 2023-02-06 DIAGNOSIS — N183 Chronic kidney disease, stage 3 unspecified: Secondary | ICD-10-CM | POA: Diagnosis not present

## 2023-02-10 DIAGNOSIS — E7849 Other hyperlipidemia: Secondary | ICD-10-CM | POA: Diagnosis not present

## 2023-02-10 DIAGNOSIS — Q446 Cystic disease of liver: Secondary | ICD-10-CM | POA: Diagnosis not present

## 2023-02-10 DIAGNOSIS — K2100 Gastro-esophageal reflux disease with esophagitis, without bleeding: Secondary | ICD-10-CM | POA: Diagnosis not present

## 2023-02-10 DIAGNOSIS — M19049 Primary osteoarthritis, unspecified hand: Secondary | ICD-10-CM | POA: Diagnosis not present

## 2023-02-10 DIAGNOSIS — I10 Essential (primary) hypertension: Secondary | ICD-10-CM | POA: Diagnosis not present

## 2023-02-10 DIAGNOSIS — N281 Cyst of kidney, acquired: Secondary | ICD-10-CM | POA: Diagnosis not present

## 2023-02-10 DIAGNOSIS — I25119 Atherosclerotic heart disease of native coronary artery with unspecified angina pectoris: Secondary | ICD-10-CM | POA: Diagnosis not present

## 2023-02-10 DIAGNOSIS — L570 Actinic keratosis: Secondary | ICD-10-CM | POA: Diagnosis not present

## 2023-02-10 DIAGNOSIS — D692 Other nonthrombocytopenic purpura: Secondary | ICD-10-CM | POA: Diagnosis not present

## 2023-02-11 DIAGNOSIS — L570 Actinic keratosis: Secondary | ICD-10-CM | POA: Diagnosis not present

## 2023-02-17 DIAGNOSIS — H353132 Nonexudative age-related macular degeneration, bilateral, intermediate dry stage: Secondary | ICD-10-CM | POA: Diagnosis not present

## 2023-05-20 NOTE — Progress Notes (Unsigned)
    Cardiology Office Note  Date: 05/21/2023   ID: HARIM SAUCER, DOB [birth year], MRN [id number]  History of Present Illness: UVALDO PARLETTE is an 82 y.o. male last seen in April 2023.  He is here for a routine visit.  Reports no exertional chest pain, stable NYHA class II dyspnea, no palpitations or syncope.  He does not notice any specific change in stamina, functional with ADLs and yard work.  I reviewed his medications.  He has follow-up lab work and visit with Dr. Reuel Boom later this month.  He does not report any intolerances, no spontaneous bleeding problems on aspirin and Plavix which we have continued long-term.  ECG today shows sinus rhythm with right bundle branch block and left anterior fascicular block.  Physical Exam: VS:  BP (!) 140/82   Pulse 74   Ht 5\' 10"  (1.778 m)   Wt 180 lb (81.6 kg)   SpO2 95%   BMI 25.83 kg/m , BMI Body mass index is 25.83 kg/m.  Wt Readings from Last 3 Encounters:  05/21/23 180 lb (81.6 kg)  11/21/22 175 lb (79.4 kg)  11/19/22 176 lb (79.8 kg)    General: Patient appears comfortable at rest. HEENT: Conjunctiva and lids normal. Neck: Supple, no elevated JVP or carotid bruits. Lungs: Clear to auscultation, nonlabored breathing at rest. Cardiac: Regular rate and rhythm, no S3 or significant systolic murmur. Extremities: No pitting edema.  ECG:  An ECG dated 03/20/2022 was personally reviewed today and demonstrated:  Sinus rhythm with right bundle branch block and left anterior fascicular block.  Labwork: 05/31/2022: ALT 13; AST 15; Hemoglobin 13.3; Platelets 229 06/01/2022: BUN 10; Creatinine, Ser 0.77; Magnesium 2.1; Potassium 3.5; Sodium 136  October 2023: Cholesterol 124, triglycerides 99, HDL 37, LDL 68, TSH 5.87, BUN 14, creatinine 1.05, potassium 4.5, AST 19, ALT 16  Other Studies Reviewed Today:  No interval cardiac testing for review today.  Assessment and Plan:  1.  CAD status post DES to the RCA in 2009.  Lexiscan Myoview  was low risk in 2017 showing evidence of inferior infarct scar.  He continues to do well with no recurrent exertional symptoms or change in stamina and we will plan continued observation without follow-up ischemic testing unless the situation changes.  ECG is stable.  Continue aspirin and Plavix, Lipitor, and Toprol-XL.  2.  Essential hypertension.  Also on Aldactone and Prinzide.  Keep follow-up with Dr. Reuel Boom.  No changes were made today.  3.  Mixed hyperlipidemia.  LDL 68 in October 2023.  He will have repeat lab work pending on Lipitor which he has tolerated well.  Disposition:  Follow up  1 year.  Signed, Jonelle Sidle, M.D., F.A.C.C. [facility]

## 2023-05-21 ENCOUNTER — Ambulatory Visit: Payer: Medicare Other | Attending: Cardiology | Admitting: Cardiology

## 2023-05-21 ENCOUNTER — Encounter: Payer: Self-pay | Admitting: Cardiology

## 2023-05-21 ENCOUNTER — Other Ambulatory Visit: Payer: Self-pay

## 2023-05-21 VITALS — BP 140/82 | HR 74 | Ht 70.0 in | Wt 180.0 lb

## 2023-05-21 DIAGNOSIS — E782 Mixed hyperlipidemia: Secondary | ICD-10-CM

## 2023-05-21 DIAGNOSIS — I25119 Atherosclerotic heart disease of native coronary artery with unspecified angina pectoris: Secondary | ICD-10-CM | POA: Diagnosis not present

## 2023-05-21 DIAGNOSIS — I10 Essential (primary) hypertension: Secondary | ICD-10-CM | POA: Diagnosis not present

## 2023-05-21 NOTE — Patient Instructions (Signed)
Medication Instructions:  Your physician recommends that you continue on your current medications as directed. Please refer to the Current Medication list given to you today.   Labwork: None today  Testing/Procedures: None today  Follow-Up: 1 year  Any Other Special Instructions Will Be Listed Below (If Applicable).  If you need a refill on your cardiac medications before your next appointment, please call your pharmacy.  

## 2023-06-10 DIAGNOSIS — L570 Actinic keratosis: Secondary | ICD-10-CM | POA: Diagnosis not present

## 2023-06-11 DIAGNOSIS — E7849 Other hyperlipidemia: Secondary | ICD-10-CM | POA: Diagnosis not present

## 2023-06-11 DIAGNOSIS — N183 Chronic kidney disease, stage 3 unspecified: Secondary | ICD-10-CM | POA: Diagnosis not present

## 2023-06-11 DIAGNOSIS — E039 Hypothyroidism, unspecified: Secondary | ICD-10-CM | POA: Diagnosis not present

## 2023-06-18 DIAGNOSIS — I10 Essential (primary) hypertension: Secondary | ICD-10-CM | POA: Diagnosis not present

## 2023-06-18 DIAGNOSIS — K2100 Gastro-esophageal reflux disease with esophagitis, without bleeding: Secondary | ICD-10-CM | POA: Diagnosis not present

## 2023-06-18 DIAGNOSIS — N281 Cyst of kidney, acquired: Secondary | ICD-10-CM | POA: Diagnosis not present

## 2023-06-18 DIAGNOSIS — Z23 Encounter for immunization: Secondary | ICD-10-CM | POA: Diagnosis not present

## 2023-06-18 DIAGNOSIS — E7849 Other hyperlipidemia: Secondary | ICD-10-CM | POA: Diagnosis not present

## 2023-06-18 DIAGNOSIS — M19049 Primary osteoarthritis, unspecified hand: Secondary | ICD-10-CM | POA: Diagnosis not present

## 2023-06-18 DIAGNOSIS — I25119 Atherosclerotic heart disease of native coronary artery with unspecified angina pectoris: Secondary | ICD-10-CM | POA: Diagnosis not present

## 2023-06-18 DIAGNOSIS — Q446 Cystic disease of liver: Secondary | ICD-10-CM | POA: Diagnosis not present

## 2023-06-18 DIAGNOSIS — L570 Actinic keratosis: Secondary | ICD-10-CM | POA: Diagnosis not present

## 2023-06-18 DIAGNOSIS — E039 Hypothyroidism, unspecified: Secondary | ICD-10-CM | POA: Diagnosis not present

## 2023-06-18 DIAGNOSIS — D692 Other nonthrombocytopenic purpura: Secondary | ICD-10-CM | POA: Diagnosis not present

## 2023-06-24 DIAGNOSIS — Z961 Presence of intraocular lens: Secondary | ICD-10-CM | POA: Diagnosis not present

## 2023-06-24 DIAGNOSIS — H353131 Nonexudative age-related macular degeneration, bilateral, early dry stage: Secondary | ICD-10-CM | POA: Diagnosis not present

## 2023-06-26 DIAGNOSIS — H353132 Nonexudative age-related macular degeneration, bilateral, intermediate dry stage: Secondary | ICD-10-CM | POA: Diagnosis not present

## 2023-06-26 DIAGNOSIS — H01002 Unspecified blepharitis right lower eyelid: Secondary | ICD-10-CM | POA: Diagnosis not present

## 2023-06-26 DIAGNOSIS — H40013 Open angle with borderline findings, low risk, bilateral: Secondary | ICD-10-CM | POA: Diagnosis not present

## 2023-06-26 DIAGNOSIS — H01001 Unspecified blepharitis right upper eyelid: Secondary | ICD-10-CM | POA: Diagnosis not present

## 2023-07-18 ENCOUNTER — Other Ambulatory Visit: Payer: Self-pay | Admitting: Cardiology

## 2023-09-25 DIAGNOSIS — H353132 Nonexudative age-related macular degeneration, bilateral, intermediate dry stage: Secondary | ICD-10-CM | POA: Diagnosis not present

## 2023-10-01 ENCOUNTER — Other Ambulatory Visit: Payer: Self-pay | Admitting: Cardiology

## 2023-10-08 DIAGNOSIS — L570 Actinic keratosis: Secondary | ICD-10-CM | POA: Diagnosis not present

## 2023-10-08 DIAGNOSIS — D044 Carcinoma in situ of skin of scalp and neck: Secondary | ICD-10-CM | POA: Diagnosis not present

## 2023-10-08 DIAGNOSIS — D485 Neoplasm of uncertain behavior of skin: Secondary | ICD-10-CM | POA: Diagnosis not present

## 2023-10-16 DIAGNOSIS — C4442 Squamous cell carcinoma of skin of scalp and neck: Secondary | ICD-10-CM | POA: Diagnosis not present

## 2023-10-20 DIAGNOSIS — E559 Vitamin D deficiency, unspecified: Secondary | ICD-10-CM | POA: Diagnosis not present

## 2023-10-20 DIAGNOSIS — E7849 Other hyperlipidemia: Secondary | ICD-10-CM | POA: Diagnosis not present

## 2023-10-20 DIAGNOSIS — N183 Chronic kidney disease, stage 3 unspecified: Secondary | ICD-10-CM | POA: Diagnosis not present

## 2023-10-20 DIAGNOSIS — K219 Gastro-esophageal reflux disease without esophagitis: Secondary | ICD-10-CM | POA: Diagnosis not present

## 2023-10-20 DIAGNOSIS — E039 Hypothyroidism, unspecified: Secondary | ICD-10-CM | POA: Diagnosis not present

## 2023-10-28 DIAGNOSIS — Z1389 Encounter for screening for other disorder: Secondary | ICD-10-CM | POA: Diagnosis not present

## 2023-10-28 DIAGNOSIS — E039 Hypothyroidism, unspecified: Secondary | ICD-10-CM | POA: Diagnosis not present

## 2023-10-28 DIAGNOSIS — I10 Essential (primary) hypertension: Secondary | ICD-10-CM | POA: Diagnosis not present

## 2023-10-28 DIAGNOSIS — E7849 Other hyperlipidemia: Secondary | ICD-10-CM | POA: Diagnosis not present

## 2023-10-28 DIAGNOSIS — Z23 Encounter for immunization: Secondary | ICD-10-CM | POA: Diagnosis not present

## 2023-10-28 DIAGNOSIS — M19049 Primary osteoarthritis, unspecified hand: Secondary | ICD-10-CM | POA: Diagnosis not present

## 2023-10-28 DIAGNOSIS — Z0001 Encounter for general adult medical examination with abnormal findings: Secondary | ICD-10-CM | POA: Diagnosis not present

## 2023-10-28 DIAGNOSIS — I25119 Atherosclerotic heart disease of native coronary artery with unspecified angina pectoris: Secondary | ICD-10-CM | POA: Diagnosis not present

## 2023-10-28 DIAGNOSIS — N281 Cyst of kidney, acquired: Secondary | ICD-10-CM | POA: Diagnosis not present

## 2023-10-28 DIAGNOSIS — Q446 Cystic disease of liver: Secondary | ICD-10-CM | POA: Diagnosis not present

## 2023-10-28 DIAGNOSIS — D692 Other nonthrombocytopenic purpura: Secondary | ICD-10-CM | POA: Diagnosis not present

## 2023-10-30 ENCOUNTER — Encounter: Payer: Self-pay | Admitting: Gastroenterology

## 2023-12-25 DIAGNOSIS — H353132 Nonexudative age-related macular degeneration, bilateral, intermediate dry stage: Secondary | ICD-10-CM | POA: Diagnosis not present

## 2024-02-12 DIAGNOSIS — M545 Low back pain, unspecified: Secondary | ICD-10-CM | POA: Diagnosis not present

## 2024-02-12 DIAGNOSIS — M542 Cervicalgia: Secondary | ICD-10-CM | POA: Diagnosis not present

## 2024-02-24 DIAGNOSIS — H353132 Nonexudative age-related macular degeneration, bilateral, intermediate dry stage: Secondary | ICD-10-CM | POA: Diagnosis not present

## 2024-04-07 DIAGNOSIS — L570 Actinic keratosis: Secondary | ICD-10-CM | POA: Diagnosis not present

## 2024-04-21 DIAGNOSIS — I10 Essential (primary) hypertension: Secondary | ICD-10-CM | POA: Diagnosis not present

## 2024-04-21 DIAGNOSIS — E039 Hypothyroidism, unspecified: Secondary | ICD-10-CM | POA: Diagnosis not present

## 2024-04-21 DIAGNOSIS — Z13220 Encounter for screening for lipoid disorders: Secondary | ICD-10-CM | POA: Diagnosis not present

## 2024-04-21 DIAGNOSIS — R5383 Other fatigue: Secondary | ICD-10-CM | POA: Diagnosis not present

## 2024-04-21 DIAGNOSIS — Z0001 Encounter for general adult medical examination with abnormal findings: Secondary | ICD-10-CM | POA: Diagnosis not present

## 2024-04-28 DIAGNOSIS — I10 Essential (primary) hypertension: Secondary | ICD-10-CM | POA: Diagnosis not present

## 2024-04-28 DIAGNOSIS — I25119 Atherosclerotic heart disease of native coronary artery with unspecified angina pectoris: Secondary | ICD-10-CM | POA: Diagnosis not present

## 2024-04-28 DIAGNOSIS — E782 Mixed hyperlipidemia: Secondary | ICD-10-CM | POA: Diagnosis not present

## 2024-04-28 DIAGNOSIS — E039 Hypothyroidism, unspecified: Secondary | ICD-10-CM | POA: Diagnosis not present

## 2024-05-12 DIAGNOSIS — M6281 Muscle weakness (generalized): Secondary | ICD-10-CM | POA: Diagnosis not present

## 2024-05-12 DIAGNOSIS — M25552 Pain in left hip: Secondary | ICD-10-CM | POA: Diagnosis not present

## 2024-05-12 DIAGNOSIS — M545 Low back pain, unspecified: Secondary | ICD-10-CM | POA: Diagnosis not present

## 2024-05-14 DIAGNOSIS — M25552 Pain in left hip: Secondary | ICD-10-CM | POA: Diagnosis not present

## 2024-05-14 DIAGNOSIS — M6281 Muscle weakness (generalized): Secondary | ICD-10-CM | POA: Diagnosis not present

## 2024-05-14 DIAGNOSIS — M545 Low back pain, unspecified: Secondary | ICD-10-CM | POA: Diagnosis not present

## 2024-05-17 DIAGNOSIS — M6281 Muscle weakness (generalized): Secondary | ICD-10-CM | POA: Diagnosis not present

## 2024-05-17 DIAGNOSIS — M25552 Pain in left hip: Secondary | ICD-10-CM | POA: Diagnosis not present

## 2024-05-17 DIAGNOSIS — M545 Low back pain, unspecified: Secondary | ICD-10-CM | POA: Diagnosis not present

## 2024-05-18 DIAGNOSIS — M545 Low back pain, unspecified: Secondary | ICD-10-CM | POA: Diagnosis not present

## 2024-05-18 DIAGNOSIS — M25552 Pain in left hip: Secondary | ICD-10-CM | POA: Diagnosis not present

## 2024-06-03 DIAGNOSIS — H353132 Nonexudative age-related macular degeneration, bilateral, intermediate dry stage: Secondary | ICD-10-CM | POA: Diagnosis not present

## 2024-06-03 DIAGNOSIS — H40013 Open angle with borderline findings, low risk, bilateral: Secondary | ICD-10-CM | POA: Diagnosis not present

## 2024-06-04 DIAGNOSIS — M545 Low back pain, unspecified: Secondary | ICD-10-CM | POA: Diagnosis not present

## 2024-06-04 DIAGNOSIS — M25511 Pain in right shoulder: Secondary | ICD-10-CM | POA: Diagnosis not present

## 2024-06-14 DIAGNOSIS — M4316 Spondylolisthesis, lumbar region: Secondary | ICD-10-CM | POA: Diagnosis not present

## 2024-06-14 DIAGNOSIS — K4020 Bilateral inguinal hernia, without obstruction or gangrene, not specified as recurrent: Secondary | ICD-10-CM | POA: Diagnosis not present

## 2024-06-14 DIAGNOSIS — M47816 Spondylosis without myelopathy or radiculopathy, lumbar region: Secondary | ICD-10-CM | POA: Diagnosis not present

## 2024-06-14 DIAGNOSIS — M5442 Lumbago with sciatica, left side: Secondary | ICD-10-CM | POA: Diagnosis not present

## 2024-06-14 DIAGNOSIS — M4807 Spinal stenosis, lumbosacral region: Secondary | ICD-10-CM | POA: Diagnosis not present

## 2024-06-14 DIAGNOSIS — M7602 Gluteal tendinitis, left hip: Secondary | ICD-10-CM | POA: Diagnosis not present

## 2024-06-14 DIAGNOSIS — S73192A Other sprain of left hip, initial encounter: Secondary | ICD-10-CM | POA: Diagnosis not present

## 2024-06-14 DIAGNOSIS — M1612 Unilateral primary osteoarthritis, left hip: Secondary | ICD-10-CM | POA: Diagnosis not present

## 2024-06-15 DIAGNOSIS — M545 Low back pain, unspecified: Secondary | ICD-10-CM | POA: Diagnosis not present

## 2024-06-24 DIAGNOSIS — H40013 Open angle with borderline findings, low risk, bilateral: Secondary | ICD-10-CM | POA: Diagnosis not present

## 2024-06-24 DIAGNOSIS — H01002 Unspecified blepharitis right lower eyelid: Secondary | ICD-10-CM | POA: Diagnosis not present

## 2024-06-24 DIAGNOSIS — H01001 Unspecified blepharitis right upper eyelid: Secondary | ICD-10-CM | POA: Diagnosis not present

## 2024-06-24 DIAGNOSIS — H01005 Unspecified blepharitis left lower eyelid: Secondary | ICD-10-CM | POA: Diagnosis not present

## 2024-06-24 DIAGNOSIS — H353132 Nonexudative age-related macular degeneration, bilateral, intermediate dry stage: Secondary | ICD-10-CM | POA: Diagnosis not present

## 2024-06-24 DIAGNOSIS — H11153 Pinguecula, bilateral: Secondary | ICD-10-CM | POA: Diagnosis not present

## 2024-06-24 DIAGNOSIS — H02834 Dermatochalasis of left upper eyelid: Secondary | ICD-10-CM | POA: Diagnosis not present

## 2024-06-24 DIAGNOSIS — Z961 Presence of intraocular lens: Secondary | ICD-10-CM | POA: Diagnosis not present

## 2024-06-24 DIAGNOSIS — H01004 Unspecified blepharitis left upper eyelid: Secondary | ICD-10-CM | POA: Diagnosis not present

## 2024-06-24 DIAGNOSIS — H02831 Dermatochalasis of right upper eyelid: Secondary | ICD-10-CM | POA: Diagnosis not present

## 2024-06-25 ENCOUNTER — Other Ambulatory Visit: Payer: Self-pay | Admitting: Cardiology

## 2024-06-25 DIAGNOSIS — M545 Low back pain, unspecified: Secondary | ICD-10-CM | POA: Diagnosis not present

## 2024-07-02 ENCOUNTER — Telehealth: Payer: Self-pay

## 2024-07-02 NOTE — Telephone Encounter (Signed)
   Pre-operative Risk Assessment    Patient Name: David Harmon  DOB: Aug 17, 1941 MRN: [id number]   Date of last office visit: 05/21/23 JAYSON SIERRAS, MD Date of next office visit: 08/02/24 JAYSON SIERRAS, MD   Request for Surgical Clearance    Procedure:  LT L3-4 IL ESI  Date of Surgery:  Clearance TBD                                Surgeon:  DR DARRYLE RUNNING Surgeon's Group or Practice Name:  [facility] Phone number:  [phone] Fax number:  [phone]   ATTN: INJECTION SCHEDULING   Type of Clearance Requested:   - Medical  - Pharmacy:  Hold Clopidogrel  (Plavix ) 7 DAYS PRIOR, ASPIRIN  (HOLD TIME WAS NOT INDICATED ON REQUEST)   Type of Anesthesia:  Not Indicated   Additional requests/questions:    Signed, Lucie DELENA Ku   07/02/2024, 2:17 PM

## 2024-07-02 NOTE — Telephone Encounter (Signed)
   Name: David Harmon  DOB: 09-Mar-1941  MRN: [id number]  Primary Cardiologist: Jayson Sierras, MD  Chart reviewed as part of pre-operative protocol coverage. Because of David Harmon's past medical history and time since last visit, he will require a follow-up in-office visit in order to better assess preoperative cardiovascular risk.  Pre-op covering staff: - Please schedule appointment and call patient to inform them. If patient already had an upcoming appointment within acceptable timeframe, please add pre-op clearance to the appointment notes so provider is aware. - Please contact requesting surgeon's office via preferred method (i.e, phone, fax) to inform them of need for appointment prior to surgery.  This message will also be routed to Dr. Sierras for input on holding both ASA and Plavix  for longer than standard protocol. (7 days) as requested below so that this information is available to the clearing provider at time of patient's appointment.   Artist Pouch, PA-C  07/02/2024, 2:42 PM

## 2024-07-05 NOTE — Telephone Encounter (Signed)
 Pt is scheduled 08/02/24 with Jayson Sierras, MD and appt notes have been updated to show preop clearance needed.

## 2024-07-06 NOTE — Telephone Encounter (Signed)
 Daughter is calling back saying her dad is in so much pain and cannot wait until Aug. She wants to know if there is anything else we can do to get him cleared

## 2024-07-06 NOTE — Telephone Encounter (Signed)
 Called patient's daughter back she stated patient is having a lot of pain and cannot wait till August to get cleared if we have anything sooner in our [place] or [place] office patient was moved up to Monday 7/28 the soonest opening I saw I made her aware to call throughout the week our offices at [place] or [place] if there is any cancellations they probably be able to get him in sooner she voiced understanding

## 2024-07-09 ENCOUNTER — Encounter: Payer: Self-pay | Admitting: Nurse Practitioner

## 2024-07-09 ENCOUNTER — Ambulatory Visit: Attending: Nurse Practitioner | Admitting: Nurse Practitioner

## 2024-07-09 VITALS — BP 138/72 | HR 56 | Ht 70.0 in | Wt 184.6 lb

## 2024-07-09 DIAGNOSIS — I10 Essential (primary) hypertension: Secondary | ICD-10-CM | POA: Diagnosis not present

## 2024-07-09 DIAGNOSIS — I2510 Atherosclerotic heart disease of native coronary artery without angina pectoris: Secondary | ICD-10-CM

## 2024-07-09 DIAGNOSIS — Z01818 Encounter for other preprocedural examination: Secondary | ICD-10-CM

## 2024-07-09 DIAGNOSIS — Z01810 Encounter for preprocedural cardiovascular examination: Secondary | ICD-10-CM | POA: Diagnosis not present

## 2024-07-09 DIAGNOSIS — I25119 Atherosclerotic heart disease of native coronary artery with unspecified angina pectoris: Secondary | ICD-10-CM

## 2024-07-09 DIAGNOSIS — E782 Mixed hyperlipidemia: Secondary | ICD-10-CM

## 2024-07-09 NOTE — Patient Instructions (Addendum)

## 2024-07-09 NOTE — Progress Notes (Addendum)
 Cardiology Office Note   Date:  07/09/2024 ID:  MEYER ARORA, DOB Jul 15, 1941, MRN [id number] PCP: Toribio Jerel MATSU, MD  [facility] Providers Cardiologist:  Jayson Sierras, MD     History of Present Illness David Harmon is a 83 y.o. male with a PMH of CAD, hypertension, mixed hyperlipidemia, who presents today for preoperative cardiovascular risk assessment.  Last seen by Dr. Sierras on May 21, 2023.  He was overall doing well at that time.  Our office was recently contacted for clearance.  He is pending LT L3-4 IL ESI.  Date of surgery is TBD.  Surgeon will be Dr. [place] Ibazebo.   He presents today for follow-up. He states he is doing well. Denies any chest pain, shortness of breath, palpitations, syncope, presyncope, dizziness, orthopnea, PND, swelling or significant weight changes, acute bleeding, or claudication.  Chief concern is his back pain that limits his activity and causing discomfort.   ROS: Negative. See HPI.   Studies Reviewed  EKG:  EKG Interpretation Date/Time:  Friday July 09 2024 14:08:09 EDT Ventricular Rate:  56 PR Interval:  268 QRS Duration:  150 QT Interval:  474 QTC Calculation: 457 R Axis:   -72  Text Interpretation: Sinus bradycardia with 1st degree A-V block Right bundle branch block Left anterior fascicular block Bifascicular block Minimal voltage criteria for LVH, may be normal variant ( R in aVL ) When compared with ECG of 21-May-2023 08:47, PR interval has increased Inverted T waves have replaced nonspecific T wave abnormality in Inferior leads Confirmed by Miriam Norris [phone]) on 07/09/2024 2:22:24 PM   Lexiscan  08/2016:  There was no ST segment deviation noted during stress. Findings consistent with prior inferior myocardial infarction without peri-infarct ischemia. This is a low risk study. There is no significant myocardium currently at jeopardy. The left ventricular ejection fraction is normal (55-65%).   Physical Exam VS:   BP 138/72   Pulse (!) 56   Ht 5' 10 (1.778 m)   Wt 184 lb 9.6 oz (83.7 kg)   SpO2 96%   BMI 26.49 kg/m        Wt Readings from Last 3 Encounters:  07/09/24 184 lb 9.6 oz (83.7 kg)  05/21/23 180 lb (81.6 kg)  11/21/22 175 lb (79.4 kg)    GEN: Well nourished, well developed in no acute distress NECK: No JVD; No carotid bruits CARDIAC: S1/S2, RRR, no murmurs, rubs, gallops RESPIRATORY:  Clear to auscultation without rales, wheezing or rhonchi  ABDOMEN: Soft, non-tender, non-distended EXTREMITIES:  No edema; No deformity   ASSESSMENT AND PLAN  Pre-operative cardiovascular risk assessment David Harmon perioperative risk of a major cardiac event is 6.6% according to the Revised Cardiac Risk Index (RCRI).  Therefore, he is at high risk for perioperative complications.   His functional capacity is good at 6.61 METs according to the Duke Activity Status Index (DASI). Recommendations: According to ACC/AHA guidelines, no further cardiovascular testing needed.  The patient may proceed to surgery at acceptable risk.   Antiplatelet and/or Anticoagulation Recommendations: Aspirin  and Clopidogrel  (Plavix ) can be held for 7 days prior to his surgery and resumed as soon as possible post op.  2. CAD Stable with no anginal symptoms. No indication for ischemic evaluation.  Continue current medication regimen. Heart healthy diet and regular cardiovascular exercise encouraged. Care and ED precautions discussed.   3. Mixed HLD No recent labs on file.  Will request most recent labs from Dr. Eleuterio PCP.  Continue current medication regimen. Heart  healthy diet and regular cardiovascular exercise encouraged.   4. HTN Blood pressure is stable. Discussed to monitor BP at home at least 2 hours after medications and sitting for 5-10 minutes.  Continue current medication regimen. Heart healthy diet and regular cardiovascular exercise encouraged.    Dispo: Follow-up with MD/APP in 1 year or sooner if  anything changes.  Signed, Almarie Crate, NP

## 2024-07-12 ENCOUNTER — Ambulatory Visit: Admitting: Nurse Practitioner

## 2024-07-12 ENCOUNTER — Encounter: Payer: Self-pay | Admitting: Family Medicine

## 2024-07-19 DIAGNOSIS — M5416 Radiculopathy, lumbar region: Secondary | ICD-10-CM | POA: Diagnosis not present

## 2024-08-02 ENCOUNTER — Ambulatory Visit: Admitting: Cardiology

## 2024-08-03 DIAGNOSIS — M5416 Radiculopathy, lumbar region: Secondary | ICD-10-CM | POA: Diagnosis not present

## 2024-08-20 DIAGNOSIS — M5416 Radiculopathy, lumbar region: Secondary | ICD-10-CM | POA: Diagnosis not present

## 2024-09-03 DIAGNOSIS — M48061 Spinal stenosis, lumbar region without neurogenic claudication: Secondary | ICD-10-CM | POA: Diagnosis not present

## 2024-09-03 DIAGNOSIS — M47816 Spondylosis without myelopathy or radiculopathy, lumbar region: Secondary | ICD-10-CM | POA: Diagnosis not present

## 2024-09-24 ENCOUNTER — Other Ambulatory Visit: Payer: Self-pay | Admitting: Cardiology

## 2024-09-29 DIAGNOSIS — L570 Actinic keratosis: Secondary | ICD-10-CM | POA: Diagnosis not present

## 2024-09-29 DIAGNOSIS — C44212 Basal cell carcinoma of skin of right ear and external auricular canal: Secondary | ICD-10-CM | POA: Diagnosis not present

## 2024-09-29 DIAGNOSIS — D485 Neoplasm of uncertain behavior of skin: Secondary | ICD-10-CM | POA: Diagnosis not present

## 2024-10-04 DIAGNOSIS — H353132 Nonexudative age-related macular degeneration, bilateral, intermediate dry stage: Secondary | ICD-10-CM | POA: Diagnosis not present
